# Patient Record
Sex: Male | Born: 1935 | Race: Black or African American | Hispanic: No | Smoking: Former smoker
Health system: Southern US, Community
[De-identification: ages and names within clinical notes are randomized; demographics above are authoritative.]

## PROBLEM LIST (undated history)

## (undated) DIAGNOSIS — I38 Endocarditis, valve unspecified: Secondary | ICD-10-CM

## (undated) DIAGNOSIS — Z860100 Personal history of colon polyps, unspecified: Secondary | ICD-10-CM

## (undated) DIAGNOSIS — K922 Gastrointestinal hemorrhage, unspecified: Secondary | ICD-10-CM

## (undated) DIAGNOSIS — Z952 Presence of prosthetic heart valve: Secondary | ICD-10-CM

## (undated) DIAGNOSIS — K219 Gastro-esophageal reflux disease without esophagitis: Secondary | ICD-10-CM

## (undated) DIAGNOSIS — E119 Type 2 diabetes mellitus without complications: Secondary | ICD-10-CM

## (undated) DIAGNOSIS — R011 Cardiac murmur, unspecified: Secondary | ICD-10-CM

## (undated) DIAGNOSIS — I10 Essential (primary) hypertension: Secondary | ICD-10-CM

## (undated) DIAGNOSIS — R0602 Shortness of breath: Secondary | ICD-10-CM

## (undated) DIAGNOSIS — I350 Nonrheumatic aortic (valve) stenosis: Secondary | ICD-10-CM

## (undated) DIAGNOSIS — I2510 Atherosclerotic heart disease of native coronary artery without angina pectoris: Secondary | ICD-10-CM

---

## 1998-10-03 HISTORY — PX: AORTIC VALVE REPLACEMENT: SHX41

## 1999-01-21 ENCOUNTER — Ambulatory Visit (HOSPITAL_COMMUNITY): Admission: RE | Admit: 1999-01-21 | Discharge: 1999-01-21 | Payer: Self-pay | Admitting: Cardiology

## 1999-03-18 ENCOUNTER — Observation Stay (HOSPITAL_COMMUNITY): Admission: AD | Admit: 1999-03-18 | Discharge: 1999-03-18 | Payer: Self-pay | Admitting: Cardiology

## 1999-03-24 ENCOUNTER — Encounter (HOSPITAL_COMMUNITY): Payer: Self-pay | Admitting: Dentistry

## 1999-03-24 ENCOUNTER — Encounter (HOSPITAL_COMMUNITY): Admission: RE | Admit: 1999-03-24 | Discharge: 1999-06-22 | Payer: Self-pay | Admitting: Dentistry

## 1999-03-30 ENCOUNTER — Ambulatory Visit (HOSPITAL_COMMUNITY): Admission: RE | Admit: 1999-03-30 | Discharge: 1999-03-30 | Payer: Self-pay | Admitting: Dentistry

## 1999-03-30 ENCOUNTER — Encounter (HOSPITAL_COMMUNITY): Payer: Self-pay | Admitting: Dentistry

## 1999-04-09 ENCOUNTER — Ambulatory Visit
Admission: RE | Admit: 1999-04-09 | Discharge: 1999-04-09 | Payer: Self-pay | Admitting: Thoracic Surgery (Cardiothoracic Vascular Surgery)

## 1999-04-09 ENCOUNTER — Encounter: Payer: Self-pay | Admitting: Thoracic Surgery (Cardiothoracic Vascular Surgery)

## 1999-04-26 ENCOUNTER — Encounter: Payer: Self-pay | Admitting: Thoracic Surgery (Cardiothoracic Vascular Surgery)

## 1999-04-27 ENCOUNTER — Inpatient Hospital Stay (HOSPITAL_COMMUNITY)
Admission: RE | Admit: 1999-04-27 | Discharge: 1999-05-07 | Payer: Self-pay | Admitting: Thoracic Surgery (Cardiothoracic Vascular Surgery)

## 1999-04-27 ENCOUNTER — Encounter: Payer: Self-pay | Admitting: Thoracic Surgery (Cardiothoracic Vascular Surgery)

## 1999-04-28 ENCOUNTER — Encounter: Payer: Self-pay | Admitting: Thoracic Surgery (Cardiothoracic Vascular Surgery)

## 1999-04-29 ENCOUNTER — Encounter: Payer: Self-pay | Admitting: Thoracic Surgery (Cardiothoracic Vascular Surgery)

## 1999-04-30 ENCOUNTER — Encounter: Payer: Self-pay | Admitting: Thoracic Surgery (Cardiothoracic Vascular Surgery)

## 1999-05-09 ENCOUNTER — Emergency Department (HOSPITAL_COMMUNITY): Admission: EM | Admit: 1999-05-09 | Discharge: 1999-05-09 | Payer: Self-pay | Admitting: Emergency Medicine

## 1999-09-28 ENCOUNTER — Ambulatory Visit (HOSPITAL_COMMUNITY): Admission: RE | Admit: 1999-09-28 | Discharge: 1999-09-28 | Payer: Self-pay | Admitting: Internal Medicine

## 1999-09-28 ENCOUNTER — Encounter (HOSPITAL_BASED_OUTPATIENT_CLINIC_OR_DEPARTMENT_OTHER): Payer: Self-pay | Admitting: Internal Medicine

## 2000-07-29 ENCOUNTER — Emergency Department (HOSPITAL_COMMUNITY): Admission: EM | Admit: 2000-07-29 | Discharge: 2000-07-29 | Payer: Self-pay | Admitting: Emergency Medicine

## 2002-10-08 ENCOUNTER — Encounter: Payer: Self-pay | Admitting: Cardiology

## 2002-10-08 ENCOUNTER — Inpatient Hospital Stay (HOSPITAL_COMMUNITY): Admission: AD | Admit: 2002-10-08 | Discharge: 2002-10-22 | Payer: Self-pay | Admitting: Cardiology

## 2002-10-11 ENCOUNTER — Encounter (INDEPENDENT_AMBULATORY_CARE_PROVIDER_SITE_OTHER): Payer: Self-pay | Admitting: *Deleted

## 2005-07-21 ENCOUNTER — Inpatient Hospital Stay (HOSPITAL_COMMUNITY): Admission: EM | Admit: 2005-07-21 | Discharge: 2005-08-02 | Payer: Self-pay | Admitting: Emergency Medicine

## 2007-10-04 HISTORY — PX: CHOLECYSTECTOMY: SHX55

## 2008-06-23 ENCOUNTER — Emergency Department (HOSPITAL_COMMUNITY): Admission: EM | Admit: 2008-06-23 | Discharge: 2008-06-24 | Payer: Self-pay | Admitting: Emergency Medicine

## 2008-08-18 ENCOUNTER — Encounter (INDEPENDENT_AMBULATORY_CARE_PROVIDER_SITE_OTHER): Payer: Self-pay | Admitting: Surgery

## 2008-08-18 ENCOUNTER — Ambulatory Visit (HOSPITAL_COMMUNITY): Admission: RE | Admit: 2008-08-18 | Discharge: 2008-08-18 | Payer: Self-pay | Admitting: Surgery

## 2009-09-16 IMAGING — RF DG CHOLANGIOGRAM OPERATIVE
1 series · 6 of 6 positions shown · non-contrast
Comparison: Ultrasound 06/24/2008

CLINICAL DATA: Chronic cholecystitis

INTRAOPERATIVE CHOLANGIOGRAM
TECHNIQUE: Multiple fluoroscopic spot radiographs were obtained
during intraoperative cholangiogram and are submitted for
interpretation post-operatively.

[Series 1: run · 3 acquisitions, 6 frames shown]
[im 1/3]
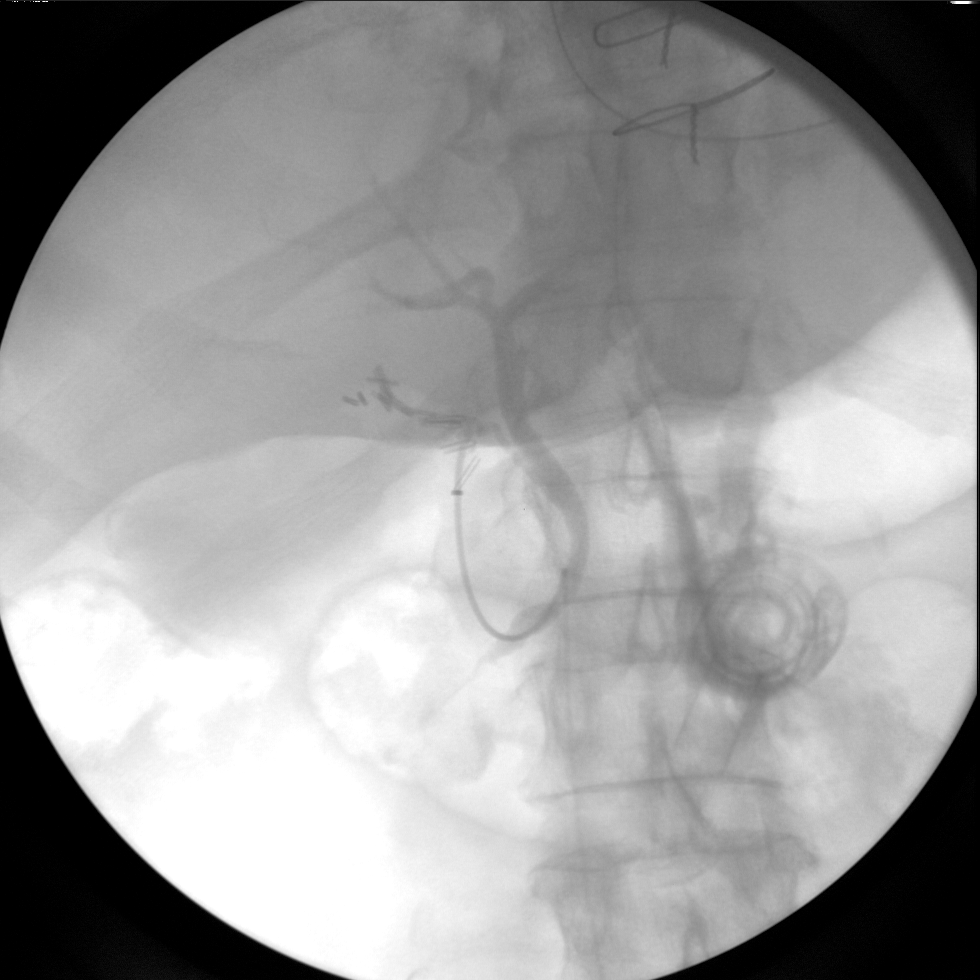
[im 1/3]
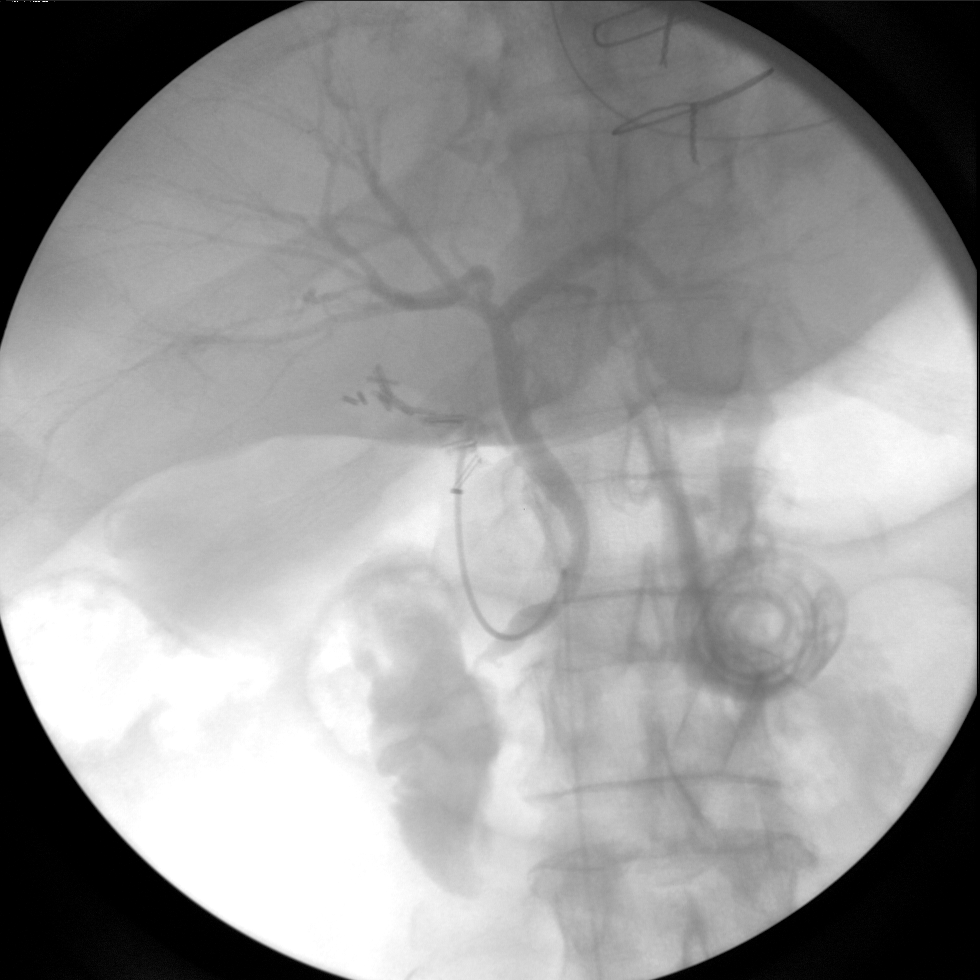
[im 1/3]
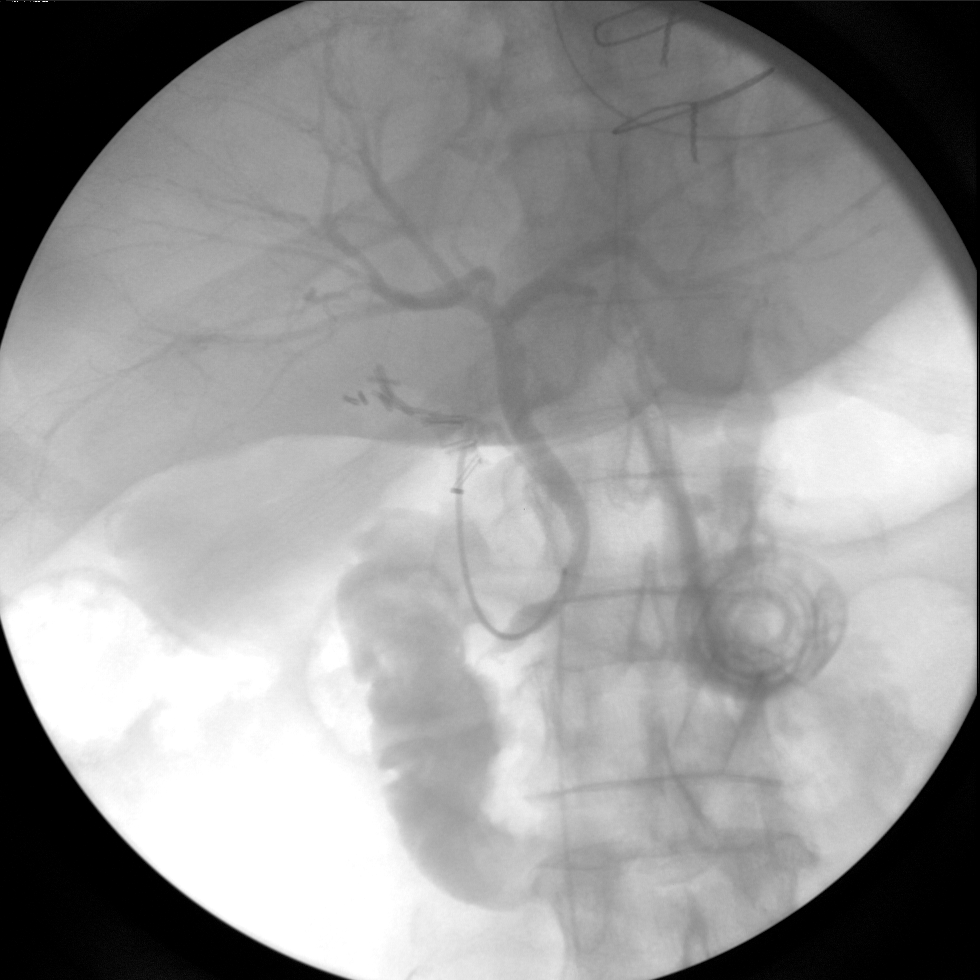
[im 1/3]
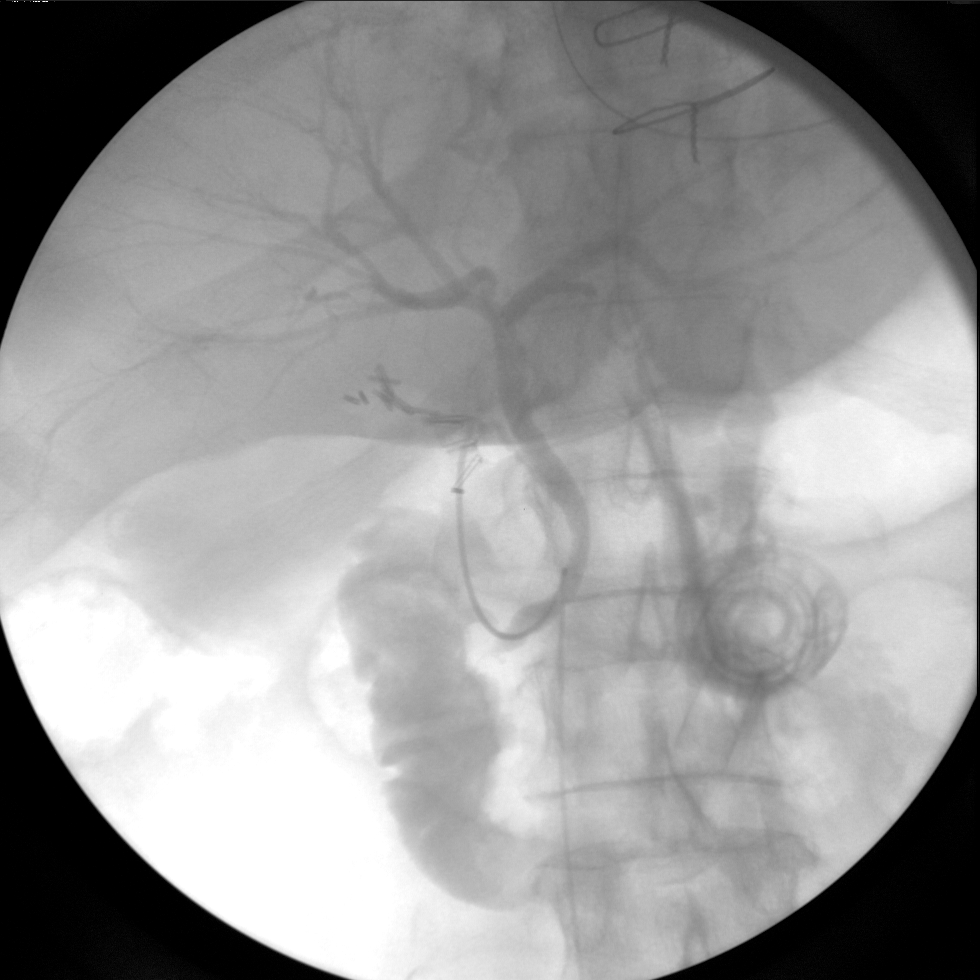
[im 2/3]
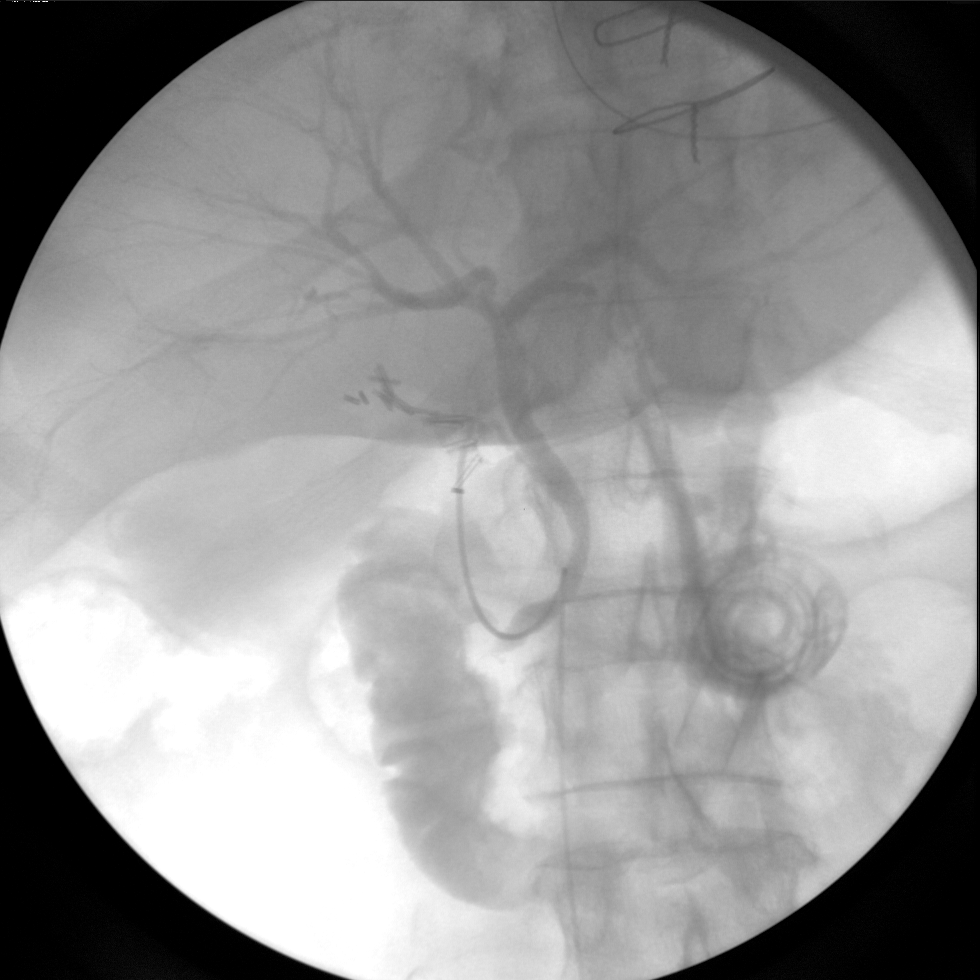
[im 3/3]
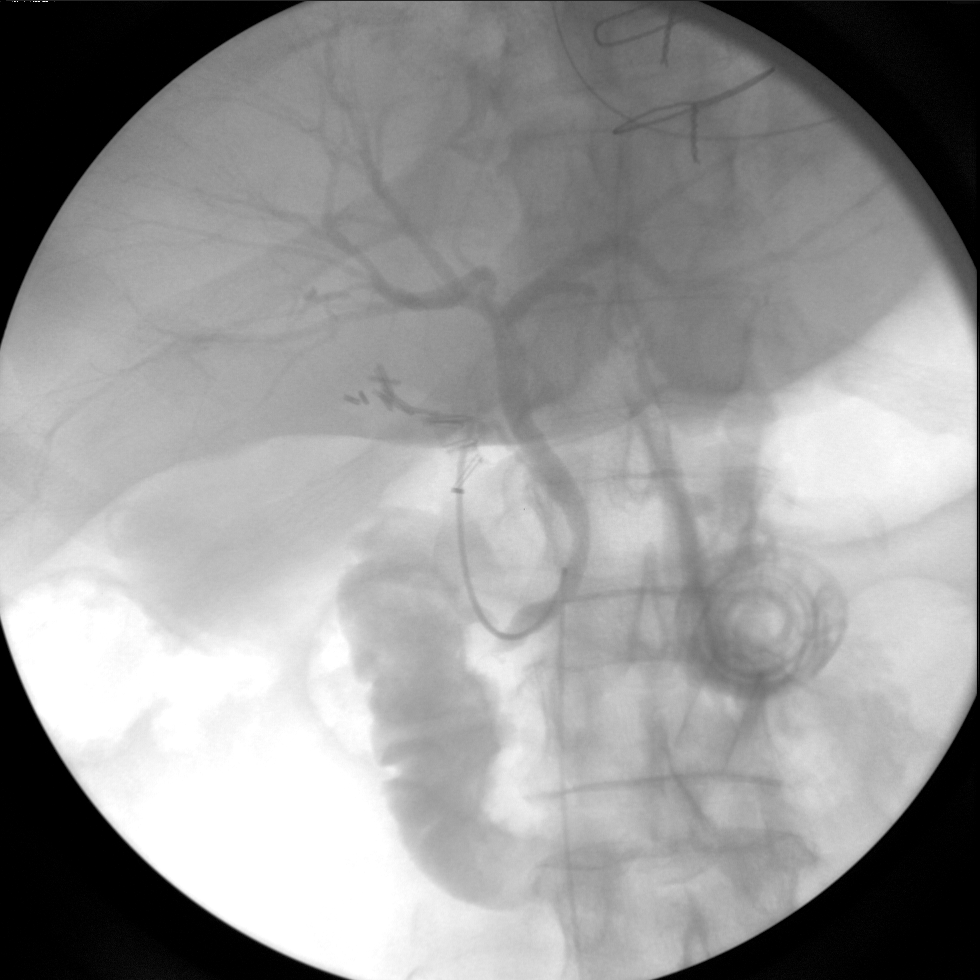

[6 of 6 positions shown; findings below may reference images not displayed]

FINDINGS: Contrast injection of the cystic duct leads to the
excellent degree of opacification of the biliary ducts.  No ductal
stricture, dilatation, or stones.  Contrast passes into the
duodenum.  IMPRESSION:
Negative operative cholangiogram.

## 2010-11-23 ENCOUNTER — Emergency Department (HOSPITAL_COMMUNITY): Payer: Medicare Other

## 2010-11-23 ENCOUNTER — Inpatient Hospital Stay (HOSPITAL_COMMUNITY)
Admission: EM | Admit: 2010-11-23 | Discharge: 2010-11-30 | DRG: 378 | Disposition: A | Discharge status: Home / Self Care | Payer: Medicare Other | Attending: Cardiology | Admitting: Cardiology

## 2010-11-23 DIAGNOSIS — Z860100 Personal history of colon polyps, unspecified: Secondary | ICD-10-CM

## 2010-11-23 DIAGNOSIS — K2970 Gastritis, unspecified, without bleeding: Secondary | ICD-10-CM | POA: Diagnosis present

## 2010-11-23 DIAGNOSIS — I10 Essential (primary) hypertension: Secondary | ICD-10-CM | POA: Diagnosis present

## 2010-11-23 DIAGNOSIS — K644 Residual hemorrhoidal skin tags: Secondary | ICD-10-CM | POA: Diagnosis present

## 2010-11-23 DIAGNOSIS — Z954 Presence of other heart-valve replacement: Secondary | ICD-10-CM

## 2010-11-23 DIAGNOSIS — K5900 Constipation, unspecified: Secondary | ICD-10-CM | POA: Diagnosis present

## 2010-11-23 DIAGNOSIS — K648 Other hemorrhoids: Secondary | ICD-10-CM | POA: Diagnosis present

## 2010-11-23 DIAGNOSIS — E7800 Pure hypercholesterolemia, unspecified: Secondary | ICD-10-CM | POA: Diagnosis present

## 2010-11-23 DIAGNOSIS — D126 Benign neoplasm of colon, unspecified: Secondary | ICD-10-CM | POA: Diagnosis present

## 2010-11-23 DIAGNOSIS — K219 Gastro-esophageal reflux disease without esophagitis: Secondary | ICD-10-CM | POA: Diagnosis present

## 2010-11-23 DIAGNOSIS — E119 Type 2 diabetes mellitus without complications: Secondary | ICD-10-CM | POA: Diagnosis present

## 2010-11-23 DIAGNOSIS — Z7901 Long term (current) use of anticoagulants: Secondary | ICD-10-CM

## 2010-11-23 DIAGNOSIS — D131 Benign neoplasm of stomach: Secondary | ICD-10-CM | POA: Diagnosis present

## 2010-11-23 DIAGNOSIS — D62 Acute posthemorrhagic anemia: Secondary | ICD-10-CM | POA: Diagnosis present

## 2010-11-23 DIAGNOSIS — K921 Melena: Principal | ICD-10-CM | POA: Diagnosis present

## 2010-11-23 DIAGNOSIS — R079 Chest pain, unspecified: Secondary | ICD-10-CM | POA: Diagnosis present

## 2010-11-23 DIAGNOSIS — I2510 Atherosclerotic heart disease of native coronary artery without angina pectoris: Secondary | ICD-10-CM | POA: Diagnosis present

## 2010-11-23 DIAGNOSIS — E669 Obesity, unspecified: Secondary | ICD-10-CM | POA: Diagnosis present

## 2010-11-23 DIAGNOSIS — R109 Unspecified abdominal pain: Secondary | ICD-10-CM | POA: Diagnosis present

## 2010-11-23 LAB — COMPREHENSIVE METABOLIC PANEL
AST: 24 U/L (ref 0–37)
Alkaline Phosphatase: 50 U/L (ref 39–117)
BUN: 33 mg/dL — ABNORMAL HIGH (ref 6–23)
CO2: 25 mEq/L (ref 19–32)
Chloride: 114 mEq/L — ABNORMAL HIGH (ref 96–112)
Creatinine, Ser: 1.28 mg/dL (ref 0.4–1.5)
GFR calc non Af Amer: 55 mL/min — ABNORMAL LOW (ref >60)
Potassium: 4.3 mEq/L (ref 3.5–5.1)
Total Bilirubin: 0.5 mg/dL (ref 0.3–1.2)

## 2010-11-23 LAB — POCT CARDIAC MARKERS
CKMB, poc: <1 ng/mL — ABNORMAL LOW (ref 1.0–8.0)
Myoglobin, poc: 85.6 ng/mL (ref 12–200)
Troponin i, poc: <0.05 ng/mL (ref 0.00–0.09)

## 2010-11-23 LAB — AMYLASE: Amylase: 124 U/L — ABNORMAL HIGH (ref 0–105)

## 2010-11-23 LAB — URINALYSIS, ROUTINE W REFLEX MICROSCOPIC
Ketones, ur: NEGATIVE mg/dL
Nitrite: NEGATIVE
Protein, ur: NEGATIVE mg/dL
Urobilinogen, UA: 1 mg/dL (ref 0.0–1.0)
pH: 6 (ref 5.0–8.0)

## 2010-11-23 LAB — CBC
Hemoglobin: 10.8 g/dL — ABNORMAL LOW (ref 13.0–17.0)
MCH: 28.1 pg (ref 26.0–34.0)
MCV: 81.8 fL (ref 78.0–100.0)
RBC: 3.85 MIL/uL — ABNORMAL LOW (ref 4.22–5.81)

## 2010-11-23 LAB — HEPATIC FUNCTION PANEL
Albumin: 3.5 g/dL (ref 3.5–5.2)
Bilirubin, Direct: 0.2 mg/dL (ref 0.0–0.3)
Indirect Bilirubin: 0.5 mg/dL (ref 0.3–0.9)
Total Bilirubin: 0.7 mg/dL (ref 0.3–1.2)

## 2010-11-23 LAB — TYPE AND SCREEN: Antibody Screen: NEGATIVE

## 2010-11-23 LAB — DIFFERENTIAL
Basophils Relative: 0 % (ref 0–1)
Lymphs Abs: 2.4 10*3/uL (ref 0.7–4.0)
Monocytes Relative: 8 % (ref 3–12)
Neutro Abs: 4.8 10*3/uL (ref 1.7–7.7)
Neutrophils Relative %: 60 % (ref 43–77)

## 2010-11-23 LAB — POCT I-STAT, CHEM 8
BUN: 41 mg/dL — ABNORMAL HIGH (ref 6–23)
Creatinine, Ser: 1.6 mg/dL — ABNORMAL HIGH (ref 0.4–1.5)
Sodium: 141 mEq/L (ref 135–145)
TCO2: 22 mmol/L (ref 0–100)

## 2010-11-23 LAB — CK TOTAL AND CKMB (NOT AT ARMC): Relative Index: INVALID (ref 0.0–2.5)

## 2010-11-23 LAB — PROTIME-INR: INR: 2.93 — ABNORMAL HIGH (ref 0.00–1.49)

## 2010-11-23 LAB — APTT: aPTT: 35 seconds (ref 24–37)

## 2010-11-23 LAB — LIPASE, BLOOD: Lipase: 44 U/L (ref 11–59)

## 2010-11-23 LAB — CARDIAC PANEL(CRET KIN+CKTOT+MB+TROPI)
Relative Index: INVALID (ref 0.0–2.5)
Total CK: 67 U/L (ref 7–232)

## 2010-11-23 LAB — HEMOGLOBIN A1C: Mean Plasma Glucose: 128 mg/dL — ABNORMAL HIGH (ref <117)

## 2010-11-24 LAB — CBC
HCT: 27.4 % — ABNORMAL LOW (ref 39.0–52.0)
Hemoglobin: 9.2 g/dL — ABNORMAL LOW (ref 13.0–17.0)
MCV: 81.1 fL (ref 78.0–100.0)
RDW: 15.8 % — ABNORMAL HIGH (ref 11.5–15.5)
WBC: 9.8 10*3/uL (ref 4.0–10.5)

## 2010-11-24 LAB — LIPID PANEL
Cholesterol: 167 mg/dL (ref 0–200)
HDL: 42 mg/dL (ref >39)
LDL Cholesterol: 93 mg/dL (ref 0–99)
Triglycerides: 158 mg/dL — ABNORMAL HIGH (ref <150)

## 2010-11-24 LAB — BASIC METABOLIC PANEL
CO2: 27 mEq/L (ref 19–32)
Chloride: 106 mEq/L (ref 96–112)
Creatinine, Ser: 1.15 mg/dL (ref 0.4–1.5)
GFR calc Af Amer: >60 mL/min (ref >60)
Potassium: 4.2 mEq/L (ref 3.5–5.1)

## 2010-11-24 LAB — CARDIAC PANEL(CRET KIN+CKTOT+MB+TROPI): CK, MB: 1.5 ng/mL (ref 0.3–4.0)

## 2010-11-24 LAB — PROTIME-INR: INR: 3.28 — ABNORMAL HIGH (ref 0.00–1.49)

## 2010-11-25 LAB — BASIC METABOLIC PANEL
CO2: 28 mEq/L (ref 19–32)
Chloride: 104 mEq/L (ref 96–112)
Creatinine, Ser: 1.21 mg/dL (ref 0.4–1.5)
GFR calc Af Amer: >60 mL/min (ref >60)
Sodium: 138 mEq/L (ref 135–145)

## 2010-11-25 LAB — CBC
HCT: 25.9 % — ABNORMAL LOW (ref 39.0–52.0)
Hemoglobin: 8.8 g/dL — ABNORMAL LOW (ref 13.0–17.0)
MCV: 82 fL (ref 78.0–100.0)
RBC: 3.16 MIL/uL — ABNORMAL LOW (ref 4.22–5.81)
RDW: 15.8 % — ABNORMAL HIGH (ref 11.5–15.5)
WBC: 9.1 10*3/uL (ref 4.0–10.5)

## 2010-11-25 LAB — PROTIME-INR: INR: 2.38 — ABNORMAL HIGH (ref 0.00–1.49)

## 2010-11-26 LAB — BASIC METABOLIC PANEL
CO2: 25 mEq/L (ref 19–32)
Calcium: 9.1 mg/dL (ref 8.4–10.5)
GFR calc Af Amer: >60 mL/min (ref >60)
GFR calc non Af Amer: 52 mL/min — ABNORMAL LOW (ref >60)
Glucose, Bld: 122 mg/dL — ABNORMAL HIGH (ref 70–99)
Potassium: 3.7 mEq/L (ref 3.5–5.1)
Sodium: 138 mEq/L (ref 135–145)

## 2010-11-26 LAB — CBC
HCT: 24.4 % — ABNORMAL LOW (ref 39.0–52.0)
Hemoglobin: 8.5 g/dL — ABNORMAL LOW (ref 13.0–17.0)
MCH: 28.8 pg (ref 26.0–34.0)
RBC: 2.95 MIL/uL — ABNORMAL LOW (ref 4.22–5.81)

## 2010-11-27 DIAGNOSIS — K921 Melena: Secondary | ICD-10-CM

## 2010-11-27 LAB — CBC
HCT: 22.6 % — ABNORMAL LOW (ref 39.0–52.0)
MCH: 27.9 pg (ref 26.0–34.0)
MCV: 83.1 fL (ref 78.0–100.0)
RBC: 2.72 MIL/uL — ABNORMAL LOW (ref 4.22–5.81)
WBC: 9 10*3/uL (ref 4.0–10.5)

## 2010-11-27 LAB — BASIC METABOLIC PANEL
BUN: 8 mg/dL (ref 6–23)
Chloride: 108 mEq/L (ref 96–112)
Creatinine, Ser: 1.22 mg/dL (ref 0.4–1.5)
Glucose, Bld: 105 mg/dL — ABNORMAL HIGH (ref 70–99)
Potassium: 3.5 mEq/L (ref 3.5–5.1)

## 2010-11-28 DIAGNOSIS — K921 Melena: Secondary | ICD-10-CM

## 2010-11-28 LAB — CROSSMATCH: Unit division: 0

## 2010-11-28 LAB — CBC
HCT: 25.6 % — ABNORMAL LOW (ref 39.0–52.0)
MCH: 28.7 pg (ref 26.0–34.0)
MCHC: 34 g/dL (ref 30.0–36.0)
MCV: 84.5 fL (ref 78.0–100.0)
RDW: 16.5 % — ABNORMAL HIGH (ref 11.5–15.5)
WBC: 10 10*3/uL (ref 4.0–10.5)

## 2010-11-29 LAB — CBC
HCT: 28.7 % — ABNORMAL LOW (ref 39.0–52.0)
MCH: 28.9 pg (ref 26.0–34.0)
MCV: 85.4 fL (ref 78.0–100.0)
RDW: 16.7 % — ABNORMAL HIGH (ref 11.5–15.5)
WBC: 9.5 10*3/uL (ref 4.0–10.5)

## 2010-11-29 LAB — BASIC METABOLIC PANEL
BUN: 8 mg/dL (ref 6–23)
GFR calc non Af Amer: >60 mL/min (ref >60)
Glucose, Bld: 98 mg/dL (ref 70–99)
Potassium: 4 mEq/L (ref 3.5–5.1)

## 2010-11-30 LAB — CBC
Hemoglobin: 9.8 g/dL — ABNORMAL LOW (ref 13.0–17.0)
MCH: 29.1 pg (ref 26.0–34.0)
MCV: 86.1 fL (ref 78.0–100.0)
Platelets: 294 10*3/uL (ref 150–400)
RBC: 3.37 MIL/uL — ABNORMAL LOW (ref 4.22–5.81)

## 2010-11-30 LAB — BASIC METABOLIC PANEL
CO2: 31 mEq/L (ref 19–32)
Glucose, Bld: 104 mg/dL — ABNORMAL HIGH (ref 70–99)
Potassium: 4.2 mEq/L (ref 3.5–5.1)
Sodium: 140 mEq/L (ref 135–145)

## 2011-01-04 NOTE — Consult Note (Signed)
[name], [name] NO.:  [id number]  MEDICAL RECORD NO.:  [id number]           PATIENT TYPE:  I  LOCATION:  3709                         FACILITY:  [facility]  PHYSICIAN:  Brian Hawks. Elnoria Howard, MD    DATE OF BIRTH:  [birth year]  DATE OF CONSULTATION:  11/23/2010 DATE OF DISCHARGE:                                CONSULTATION   REASON FOR CONSULTATION:  Anemia, heme-positive stool, and melena.  REFERRING PHYSICIAN:  Eduardo Powell. [name], M.D.  HISTORY OF PRESENT ILLNESS:  This is a 77 year old gentleman with a past medical history of coronary artery disease, status post AVR for severe aortic stenosis, hypertension, hyperlipidemia, gastroesophageal reflux disease, diabetes, and colonic polyps who is admitted to the hospital with complaints of abdominal pain and also melena.  The patient states that his abdominal pain has been chronic and has been unchanged over the years; however, what concerned him is when he started to see melena.  In the past, he did have issues with a GI bleed in 2004, and he underwent an EGD and colonoscopy by Dr. Loreta Ave.  At that time, he was only noted to have some adenomatous polyps.  The EGD was negative for any obvious bleeding site.  At that time, the patient was also on Coumadin.  Since that event, the patient has not had any further issues with bleeding until this time.  He denies having taken any NSAIDs, and his INR appears to be in the therapeutic window at this time.  He currently takes omeprazole 40 mg p.o. daily and has been taking that for a number of years currently.  During this evaluation, his hemoglobin is noted to be at 11.6, but the last value was in 2009, and it was in the 13 range.  PAST MEDICAL HISTORY AND PAST SURGICAL HISTORY:  As stated above, with the addition of status post laparoscopic cholecystectomy in 2009 for cholelithiasis.  SOCIAL HISTORY:  Negative for alcohol, tobacco, or illicit drug use currently.  FAMILY  HISTORY:  Noncontributory.  ALLERGIES:  No known drug allergies.  MEDICATIONS: 1. Metoprolol 12.5 mg p.o. b.i.d. 2. Nitroglycerin 0.5 inches transdermal patch q.6 h. 3. Protonix 40 mg IV q.12 h. 4. Crestor 20 mg p.o. daily.  PHYSICAL EXAMINATION:  VITAL SIGNS:  Blood pressure is 165/90, heart rate is 82, respirations 20, and temperature is 97.7. GENERAL:  The patient is in no acute distress, alert and oriented. HEENT:  Normocephalic, atraumatic.  Extraocular muscles intact. NECK:  Supple.  No lymphadenopathy. LUNGS:  Clear to auscultation bilaterally. CARDIOVASCULAR:  Regular rate and rhythm. ABDOMEN:  Moderately obese, soft, nontender, and nondistended.  Positive bowel sounds. EXTREMITIES:  No clubbing, cyanosis, or edema. RECTAL:  Significant for melena and is heme-positive.  LABORATORY VALUES:  White blood cell count is 7.9, hemoglobin 11.6, MCV is 81.8, and platelets at 239.  PT is 30.6, INR 2.9.  Sodium 141, potassium 2.9, chloride 110, glucose 123, BUN 41, and creatinine 1.6. Total bilirubin 0.7, alk phos 55, AST 30, ALT 42, and albumin 3.5.  IMPRESSION: 1. Heme-positive stool. 2. Melena. 3. Chronic midabdominal discomfort.  At this time, it  is clear that the patient does have a bleeding source. There is overt melena, and in light of his complaint of upper abdominal pain and maybe that he has an upper GI source, further evaluation with an EGD is necessary at this time.  His INR is currently elevated, and I think a diagnostic EGD will be acceptable.  His hemoglobin needs to be followed and transfusion will be required, and also, reversal of his anticoagulation will be necessary if a significant loss of blood is noted.     Brian Hawks Elnoria Howard, MD     PDH/MEDQ  D:  11/23/2010  T:  11/24/2010  Job:  [id number]  Electronically Signed by Jeani Hawking MD on 01/04/2011 08:51:41 AM

## 2011-01-06 NOTE — Discharge Summary (Signed)
[name], [name] NO.:  [id number]  MEDICAL RECORD NO.:  [id number]           PATIENT TYPE:  I  LOCATION:  3709                         FACILITY:  [facility]  PHYSICIAN:  Chisa Kushner N. Sharyn Lull, M.D. DATE OF BIRTH:  09/24/1933  DATE OF ADMISSION:  11/23/2010 DATE OF DISCHARGE:  11/30/2010                              DISCHARGE SUMMARY   ADMITTING DIAGNOSES: 1. Chest pain, rule out myocardial infarction. 2. Abdominal pain with black tarry stools, rule out upper     gastrointestinal bleeding. 3. Anemia secondary to above. 4. Status post aortic valve replacement. 5. Hypertension. 6. Non-insulin-dependent diabetes mellitus, controlled by diet. 7. Hypercholesteremia. 8. History of colonic polyps. 9. Gastroesophageal reflux disease.  DISCHARGE DIAGNOSES: 1. Status post gastrointestinal bleeding, stable, source unknown. 2. Status post atypical chest pain, myocardial infarction ruled out. 3. Mild coronary artery disease in the past status post aortic valve     replacement. 4. Hypertension. 5. Non-insulin-dependent diabetes mellitus, controlled by diet. 6. Hypercholesteremia. 7. History of colonic polyp. 8. Gastroesophageal reflux disease. 9. Mild gastritis. 10.Anemia secondary to gastrointestinal bleeding.  DISCHARGE HOME MEDICATIONS: 1. Coumadin 6.5 mg daily. 2. Metoprolol 25 mg 1/2 tablet twice daily. 3. Quinapril 20 mg 1 tablet twice daily. 4. Simvastatin 80 mg 1 tablet daily at night. 5. Omeprazole 40 mg 1 capsule every morning. 6. Proscar 5 mg 1 tablet daily as before. 7. Lumigan eyedrops as before. 8. Feosol 325 mg 1 tablet twice daily.  DIET:  Low salt, low cholesterol.  The patient has been advised to avoid green leafy vegetables.  Also, the patient has been advised to avoid sweets.  Increase activity slowly as tolerated.  CONDITION AT DISCHARGE:  Stable.  Follow up with me in 1 week and Dr. Loreta Ave in 2 weeks.  The patient has been advised if he  feels dizzy, lightheaded, weak, or notices any black tarry, foul swelling stool should report to me immediately and go to the ER.  We will check CBC and BMET in 1 week.  We will check ProTime also in 1 week.  BRIEF HISTORY AND HOSPITAL COURSE:  Mr. Loughry is a 77 year old black male with past medical history significant for mild coronary artery disease, history of severe aortic stenosis status post AVR, hypertension, hypercholesteremia, GERD, history of colonic polyp, non- insulin-dependent diabetes mellitus controlled by diet.  He came to the ER complaining of retrosternal and upper abdominal tightness associated with feeling weak, dizzy, nausea, and diaphoresis.  He states he noticed black tarry stools on Sunday and this morning.  He denies any syncopal episode.  The patient was noted to have blood pressure of 80-90 systolic, received fluid challenge in the ER with normalization of blood pressure.  He denies any chest pain at present.  The patient gives history of exertional chest pain off and on associated with dyspnea.  He denies any PND, orthopnea, leg swelling.  He denies palpitation, lightheadedness, or syncope.  He denies cough, fever, or chills.  He denies any bright red blood per rectum or hematuria.  PAST MEDICAL HISTORY:  As above.  PAST SURGICAL HISTORY:  He had AVR in  the past, had cholecystectomy in November 2009.  ALLERGIES:  No known drug allergies.  MEDICATION AT HOME: 1. He is on Coumadin 6.5 mg 5 days a week and 8 mg 2 days a week. 2. Quinapril 20 mg p.o. b.i.d. 3. Lopressor 25 mg 1/2 tablet b.i.d. 4. Simvastatin 80 mg p.o. at bedtime. 5. Omeprazole 40 mg p.o. daily. 6. Proscar 5 mg p.o. daily.  SOCIAL HISTORY:  He is widowed, retired, worked in [employer] in the past. No history of smoking or alcohol abuse.  Born in [place], lives now in [place].  FAMILY HISTORY:  Noncontributory.  PHYSICAL EXAMINATION:  GENERAL:  He is alert, awake, and oriented x3,  in no acute distress. VITAL SIGNS:  Blood pressures when seen in the ER was 145/53, pulse was 63 and regular. HEENT:  Conjunctivae pink. NECK:  Supple.  No JVD, no bruit. RESPIRATORY:  Lungs are clear to auscultation without rhonchi or rales. CARDIOVASCULAR:  S1 and S2 was normal.  There was soft systolic murmur. Metallic heart sounds.  There was no S3 gallop. ABDOMEN:  Soft.  Bowel sounds were present, nontender. EXTREMITIES:  There is no clubbing, cyanosis, or edema.  LABS:  Hemoglobin was 10.8, hematocrit 31.5, white count of 7.9.  His PT/INR, PT was 30.6, INR 2.93.  Stool occult blood was positive.  Lipase was 44.  Two sets of cardiac enzymes were negative.  Hemoglobin A1c was 6.1.  Repeat hemoglobin was 9.2 on November 24, 2010.  On November 27, 2010, hemoglobin was 7.6, hematocrit 22.6.  The patient received 1 unit of packed RBCs.  Hemoglobin on November 28, 2010 was 8.7, hematocrit 25.6, white count of 10.0.  On November 29, 2010, hemoglobin was 9.7, hematocrit 28.7.  Today, hemoglobin is 9.8, hematocrit 29, white count of 8.7.  INR today is 2.12.  His sodium is 139, potassium 4.0, BUN 8, creatinine 1.18.  Glucose was 98.  BRIEF HOSPITAL COURSE:  The patient was admitted to the telemetry unit. MI was ruled out by serial enzymes and EKG.  The patient subsequently underwent GI consultation, was obtained by Dr. Loreta Ave and Dr. Elnoria Howard.  The patient subsequently underwent upper endoscopy which showed normal esophagus and gastroesophageal junction.  There was patchy gastritis in mid body of the stomach.  Few sessile polyps in the mid body of the stomach were also noted.  The patient subsequently underwent colonoscopy on November 26, 2010, by Dr. Elnoria Howard, which showed sessile polyp, internal and external hemorrhoids.  There was no evidence of active bleeding. The patient subsequently underwent capsule endoscopy which was also not revealing for any active GI bleeding, capsule remained in the  stomach for more than 2-1/2 hours and so the battery expired before complete view of the small bowel.  The patient's hemoglobin has remained stable. The patient was restarted on Coumadin.  Her INR today is 2.12.  The patient has no further active bleeding and is feeling fine.  The patient will be discharged home on above medications and will be followed up in my office and GI as above.  The patient may require capsule study as an outpatient per GI.     Eduardo Osier. Sharyn Lull, M.D.     MNH/MEDQ  D:  11/30/2010  T:  11/30/2010  Job:  [id number]  cc:   Jordan Hawks. Elnoria Howard, MD Anselmo Rod, MD, [facility]  Electronically Signed by Rinaldo Cloud M.D. on 01/06/2011 10:24:14 PM

## 2011-02-15 NOTE — Op Note (Signed)
[name], [name] NO.:  [id number]   MEDICAL RECORD NO.:  [id number]          PATIENT TYPE:  AMB   LOCATION:  SDS                          FACILITY:  [facility]   PHYSICIAN:  Ardeth Sportsman, MD     DATE OF BIRTH:  [birth year]   DATE OF PROCEDURE:  08/18/2008  DATE OF DISCHARGE:  08/18/2008                               OPERATIVE REPORT   PRIMARY CARE PHYSICIAN:  1. Cardiologist, Eduardo Osier. Sharyn Lull, MD  2. Gastroenterologist, Anselmo Rod, MD   SURGEON:  Ardeth Sportsman, MD.   ASSISTANT:  RNFA.   PREOPERATIVE DIAGNOSIS:  Chronic cholecystitis.   POSTOPERATIVE DIAGNOSIS:  Chronic cholecystitis.   PROCEDURE PERFORMED:  Laparoscopic cholecystectomy with intraoperative  cholangiogram.   ANESTHESIA:  1. General anesthesia.  2. Local anesthetic and a field block on all port sites.   SPECIMEN:  Gallbladder.   DRAINS:  None.   ESTIMATED BLOOD LOSS:  100 mL.   COMPLICATIONS:  No major complications.   INDICATIONS:  [name] is a 77 year old gentleman with cardiopulmonary  issues that are stable, but has a classic story of biliary colic with an  ultrasound showing gallbladder wall thickening, pericholecystic fluid  with evidence concerning for cholecystitis.   The anatomy and physiology of hepatobiliary and pancreatic function was  discussed.  Pathophysiology of cholecystitis was discussed with its  natural history of risks.  Options were discussed.  Recommendation was  made for laparoscopic cholecystectomy with intraoperative cholangiogram.  Risks, benefits, and alternatives were discussed.  Questions were  answered and he agreed to proceed.   OPERATIVE FINDINGS:  He actually had gallbladder wall thickening with  some duodenal adhesions.  His cholangiogram was normal.  He had a  fibrocystic duct/infundibular junction.   DESCRIPTION OF PROCEDURE:  Informed consent was confirmed.  The patient  received IV cefazolin and gentamicin given his history of  aortic valve  replacement.  He had been held on his Coumadin and had been transitioned  to Lovenox shots.  His INR was 1 this morning.  He underwent general  anesthesia without any difficulty.  He had voided just prior entering  the operating room.  He had sequential compression devices active during  the entire case.  He was positioned supine, both arms tucked.  His  abdomen was clipped, prepped, and draped in sterile fashion.   A 5-mm port was placed in the right upper quadrant using optical imaging  technique with the patient in deep reverse Trendelenburg and right side  up.  A capnoperitoneum to 15 mmHg provided a good intraabdominal  insufflation.  Under direct visualization, the final port was placed  through the right flank and hid in the umbilicus.  A 10-mm port was  placed in the right subxiphoid region through a prior mediastinal chest  tube scar.   Diagnostic laparoscopy revealed no intraabdominal injury.  Gallbladder  fundus was grasped and elevated cephalad.  Peritoneal coverings between  the anterior and medial wall of the gallbladder and posterolateral wall  of the gallbladder and the liver were freed off.  The patient had  dilated veins  in gossamer arteries and did bring in some bleeding as the  anterior branch of the cystic artery was densely adherent to the  infundibulum.  Eventually, I was able to hold pressure, skeletonized,  and then isolated.  He had a large posterior branch as well.  There is  some bleeding as well.  One clip was placed on the gallbladder site and  2 clips on the proximal, and the remainder on these branches and they  were transected.  There was still a little bit of oozing, but careful  skeletonization was done and a couple of blood vessels on the the liver  were isolated and clipped and controlled as well.  Copious irrigation  was done and hemostasis was assured and careful inspections to make sure  that these ligations again with the  gallbladder with a good critical  view.  Further dissection was done to free almost all the gallbladder  off the liver bed to have an excellent posterior view in doing this  dissection and excellent critical 360 degree view.  The infundibulum and  proximal cystic duct were isolated almost all the way down to the cystic  duct common bile duct junction since the cystic duct was much short and  fibrotic.   A partial cystic ductotomy was performed at the infundibulum and cystic  duct ridge.  A 4-French Cholangiocath was placed through the right  subxiphoid region through a prior scar site and flushed in.  I initially  could not get in through into the infundibulum and cystic duct  junctions, I had to transect another centimeter or more proximally.  The  catheter passed much more easily there.   Cholangiogram was run using dilute radiopaque contrast and continuous  fluoroscopy.  Contrast flowed well from the side branch consistent with  cystic duct cannulization.  Contrast flowed and refluxed well into the  right and left intrahepatic chains across the hepatic duct and common  bile duct across the normal ampulla into a normal duodenum.  Cholangiocath was removed.  A clip was placed in the cystic duct stump.  I did a 0 PDS Endoloop to ligate the cystic duct stump as well to good  result.   The gallbladder was freed from its main attachments to the liver bed and  brought up through the 2 mm subxiphoid port site with excellent gentle  dilation and evacuation of bile.  There was some small black, granular  stones, but they were very tiny.   Copious irrigation was done with excellent hemostasis.  Capnoperitoneum  was evacuated and ports were removed.  The fascial defect of the  subxiphoid region was closed using 0 Vicryl stitch.  Skin was closed  using 4-0 Monocryl stitch.  Sterile dressing was applied.  The patient  was extubated and sent to recovery room in stable condition.   I explained  the postoperative care with the patient in detail just prior  to surgery and discussed with the family afterwards.      Ardeth Sportsman, MD  Electronically Signed     SCG/MEDQ  D:  08/18/2008  T:  08/19/2008  Job:  [id number]   cc:   Eduardo Osier. Sharyn Lull, M.D.  Anselmo Rod, M.D.

## 2011-02-18 NOTE — Op Note (Signed)
   NAMEJANTZ, MAIN NO.:  [id number]   MEDICAL RECORD NO.:  [id number]                   PATIENT TYPE:  INP   LOCATION:  2023                                 FACILITY:  [facility]   PHYSICIAN:  Anselmo Rod, M.D.               DATE OF BIRTH:  01-15-1933   DATE OF PROCEDURE:  10/10/2002  DATE OF DISCHARGE:                                 OPERATIVE REPORT   PROCEDURE PERFORMED:  Diagnostic esophagogastroduodenoscopy.   ENDOSCOPIST:  Charna Elizabeth, M.D.   INSTRUMENT USED:  Olympus video panendoscope.   INDICATIONS FOR PROCEDURE:  The patient is a 77 year old African-American  male with history of melena and a drop in his hemoglobin down to 7.9 gm/dl.  Rule out peptic ulcer disease, esophagitis, gastritis, etc.  The patient has  had some left upper quadrant pain in the past.   PREPROCEDURE PREPARATION:  Informed consent was procured from the patient.  The patient was fasted for eight hours prior to the procedure and received  10 mg of vitamin K along with 2 units of fresh frozen plasma the night prior  to the procedure.  PT was rechecked.  The INR was 1.4 prior to the  endoscopy.   DESCRIPTION OF PROCEDURE:  The patient was placed in the left lateral  decubitus position and sedated with 40 mg of Demerol and 4 mg of Versed  intravenously.  Once the patient was adequately sedated and maintained on  low-flow oxygen and continuous cardiac monitoring, the Olympus video  panendoscope was advanced through the mouth piece over the tongue into the  esophagus under direct vision.  The entire esophagus appeared normal with no  evidence of ring, stricture, masses, esophagitis or Barrett's mucosa.  The  scope was then advanced to the stomach.  The entire gastric mucosa and the  proximal small bowel appeared normal without lesions.   IMPRESSION:  Normal esophagogastroduodenoscopy, no arteriovenous  malformations, erosions, ulcerations, masses or polyps seen.   RECOMMENDATIONS:  1. Proceed with colonoscopy in the morning.  Instructions written.  2.     Start heparin today.  3. Hold heparin six hours prior to colonoscopy.  This has been discussed     with Dr. Sharyn Lull who is aware of the findings.  Further recommendation     made after the colonoscopy.                                                Anselmo Rod, M.D.    JNM/MEDQ  D:  10/10/2002  T:  10/10/2002  Job:  [id number]   cc:   Eduardo Osier. Sharyn Lull, M.D.  [address]  [place]  [place] [address]  Fax: [phone]

## 2011-02-18 NOTE — Discharge Summary (Signed)
[name], [name]   MEDICAL RECORD NO.:  [id number]                   PATIENT TYPE:  INP   LOCATION:  2023                                 FACILITY:  [facility]   PHYSICIAN:  Mohan N. Sharyn Lull, M.D.              DATE OF BIRTH:  01/01/1933   DATE OF ADMISSION:  10/08/2002  DATE OF DISCHARGE:  10/22/2002                                 DISCHARGE SUMMARY   ADMISSION DIAGNOSES:  1. Upper gastrointestinal bleeding, rule out gastritis, rule out peptic     ulcer disease.  2. Status post aortic valve replacement on Coumadin.  3. Hypertension.  4. Non-insulin-dependent diabetes mellitus.  5. Remote history of tobacco abuse.  6. Remote history of alcohol abuse.  7. Hypercholesterolemia.   FINAL DIAGNOSES:  1. Status post gastrointestinal bleeding.  2. Hypertension.  3. Non-insulin-dependent diabetes mellitus controlled by diet.  4. Status post aortic valve replacement for aortic stenosis.  5. Remote history of alcohol abuse.  6. Remote history of tobacco abuse.  7. Hypercholesterolemia.   DISCHARGE MEDICATIONS:  1. Coumadin 8 mg 1 tablet daily.  2. Altace 10 mg 1 capsule twice daily.  3. Toprol XL 25 mg 1 tablet daily.  4. Clonidine 0.2 mg 1/2 tablet twice daily.  5. Nexium 40 mg 1 capsule daily 1/2 hour before breakfast.  6. Lipitor 20 mg 1 tablet daily.  7. Feosol 325 mg 1 tablet twice daily.   ACTIVITY:  As tolerated.   DIET:  Low-salt, low-cholesterol, 1800 calorie ADA diet.   SPECIAL INSTRUCTIONS:  The patient has been advised to watch for signs of  bleeding.  If he notices any back tarry stools or blood urine, should report  to me immediately; or if he feels dizzy, lightheaded, he should call me  immediately.  CBC and pro time in one week.   FOLLOW UP:  Follow up with me in one week.   CONDITION ON DISCHARGE:  Stable.   BRIEF HISTORY:  The patient is a 77 year old black male with past medical  history significant for  hypertension, severe senile aortic stenosis status  post aortic valve replacement, non-insulin-dependent diabetes mellitus  controlled by diet, hypercholesterolemia, history of family  hyperaldosteronism.  He came to the office complaining of vague left upper  quadrant abdominal pain associated with feeling weak, tired, and dizzy.  The  patient that for the last two to three days, his stools appears to be  slightly dark.  The patient had CBC done yesterday which showed significant  drop of hemoglobin from 12.9 to 8.6, and in last six months.  The patient  denies any chest pain, nausea, vomiting, or diaphoresis.  He denies  palpitations, lightheadedness, or syncopal episodes.  Denies PND, orthopnea,  leg swelling.  Denies fever or chills, cough.  Denies any aspirin intake or  NSAIDs in the recent past.   PAST MEDICAL HISTORY:  As above.  PAST SURGICAL HISTORY:  He had aortic valve replacement in August 2000.   ALLERGIES:  No known drug allergies.   HOME MEDICATIONS:  1. Lipitor 20 mg p.o. daily.  2. Coumadin 8 mg p.o. daily.  3. Altace 10 mg p.o. b.i.d.  4. Baby aspirin 81 mg p.o. daily.  5. Toprol XL 25 mg p.o. daily.  6. Nexium 40 mg p.o. daily.  7. Clonidine 0.2 mg p.o. b.i.d.   SOCIAL HISTORY:  He is widowed, lives with his daughter.  Smoked one pack  per day for 35 years, quit in 1980s.  He used to drink beer, quit  approximately five years ago.  Born in [place], works in [employer].   FAMILY HISTORY:  Negative for coronary artery disease.   PHYSICAL EXAMINATION:  GENERAL:  Alert, awake, oriented x 3.  VITAL SIGNS:  Blood pressure 116/70, pulse 84 and regular.  HEENT:  Conjunctivae pink.  NECK:  Supple.  No JVD, no bruit.  LUNGS:  Clear to auscultation without rhonchi or rales.  CARDIOVASCULAR:  S1 and S2 normal.  There were metallic heart sounds.  ABDOMEN:  Soft.  Bowel sounds were present.  There was mild epigastric  tenderness.  There was no guarding.   EXTREMITIES:  There was no clubbing, cyanosis, or edema.   LABORATORY DATA:  Chest x-ray showed no evidence of active cardiopulmonary  disease.   EKG showed normal sinus rhythm with nonspecific T wave changes.   Admission hemoglobin was 8.6, hematocrit 26.0.  Repeat hemoglobin on January  7 was 7.9, hematocrit 23.4, platelet count 239, white count 6.1.  On January  8 post transfusion of 2 units of packed red blood cells, hemoglobin was 9.6,  hematocrit 38.2, white count 6.2.  Hemoglobin today is 9.6, hematocrit 38.9  which has been stable.   Sodium 132, potassium 3.9, chloride 101, bicarb 26, glucose 105, BUN 21,  creatinine 1.2.  Liver enzymes were normal. Admission PT was 23.8, INR 2.5,  PTT 37.  On January 7, PT was 26, INR 2.9.  On January 8 after receiving FFP  and vitamin K, PT was 16.8, INR 1.4.  Today's PT is 22.7 with INR of 2.3.  Potassium is 4.6, BUN 10, creatinine 1.0.  Hemoglobin 9.6, hematocrit 28.9.   HOSPITAL COURSE:  The patient was admitted to telemetry unit with GI  bleeding.  GI consultation was obtained with Dr. Anselmo Rod,  The  patient received vitamin K and FFP and received 3 units of packed red blood  cells.  The patient did not have any further episodes of bleeding during the  hospital stay.  The patient was restarted on heparin due to metallic aortic  valve which he tolerated well.   The patient subsequently underwent upper endoscopy and colonoscopy on  January 8 and January 9.  There were no ulcers or AVM noted.  The patient  had full colonic polyp snared from the left colon.  The patient was  restarted on heparin and then Coumadin the next day after the polypectomy  and colonoscopy.  The patient did not have any episodes of GI bleeding  during the hospital stay.  His INR today is 2.3.  The patient is eager to go home and will be discharged on the above medications.  We will give her one  dose of 10 mg Coumadin before discharge.  The patient will be  followed up in  my office in one week.  The patient also will be followed by  GI in two  weeks.                                               Eduardo Osier. Sharyn Lull, M.D.    MNH/MEDQ  D:  10/22/2002  T:  10/22/2002  Job:  [id number]   cc:   Anselmo Rod, M.D.  [address]  [address]  [place]  [place] [address]  Fax: [phone]

## 2011-02-18 NOTE — Discharge Summary (Signed)
[name], [name] NO.:  [id number]   MEDICAL RECORD NO.:  [id number]          PATIENT TYPE:  INP   LOCATION:  2010                         FACILITY:  [facility]   PHYSICIAN:  Mohan N. Sharyn Powell, M.D. DATE OF BIRTH:  10-27-1932   DATE OF ADMISSION:  07/21/2005  DATE OF DISCHARGE:  08/02/2005                                 DISCHARGE SUMMARY   ADMISSION DIAGNOSES:  1.  Gross hematuria secondary to Coumadin toxicity precipitated by recent      antibiotics.  2.  Uncontrolled hypertension.  3.  Status post aortic valve replacement.  4.  Non-insulin-dependent diabetes mellitus controlled by diet.  5.  Hypercholesterolemia.  6.  Gastroesophageal reflux disease.   FINAL DIAGNOSES:  1.  1.  Gross hematuria secondary to Coumadin toxicity precipitated by      recent antibiotics.  2.  Uncontrolled hypertension.  3.  Status post aortic valve replacement.  4.  Non-insulin-dependent diabetes mellitus controlled by diet.  5.  Hypercholesterolemia.  6.  Gastroesophageal reflux disease.   DISCHARGE MEDICATIONS:  1.  Coumadin 8 mg, one tablet daily six days a week.  No Coumadin on Sunday.  2.  Toprol-XL 25 mg, one tablet daily.  3.  Altace 10 mg, once capsule twice daily.  4.  Simvastatin 40 mg, one tablet daily at night.  5.  Clonodin 0.2 mg, one tablet daily.  6.  Avapro 300 mg, one tablet daily.  7.  Avodart 0.5 mg, one tablet daily.  8.  Macrobid one daily for 10 days.  9.  Prilosec 20 mg, one twice daily.   DIET:  Low salt, low cholesterol, 1800 calorie ADA diet.  The patient has  been advised to avoid sweets.   ACTIVITY:  As tolerated.   FOLLOW UP:  CBC, urinalysis, and protime in one week.  The patient has been  advised to call me immediately if he notices any blood in the urine or if he  feels dizzy, lightheaded, and weak.  He is to follow up with me and Dr.  Wanda Plump in one week.   CONDITION ON DISCHARGE:  Stable.   HISTORY OF PRESENT ILLNESS:  [name]  is a 77 year old black male with past  medical history significant for aortic stenosis status post metallic aortic  valve replacement, hypertension, hypercholesterolemia, GERD, history of GI  bleeding in the past, non-insulin-dependent diabetes mellitus controlled by  diet.  He came to the ER complaining of blood in the urine since this a.m.  He states that he was started on Flagyl and tetracycline by GI approximately  two weeks ago following upper endoscopy and was noted to have Helicobater  pylori.  He was noted in the ER to have an INR of 10.6.  The patient denies  any chest pain, dizziness, lightheadedness, or syncope.  He denies  palpitations, denies shortness of breath.  He denies bright red blood per  rectum, denies abdominal pain.  Denies fever, chills, cough, or hemoptysis.   PAST MEDICAL HISTORY:  As above.   PAST SURGICAL HISTORY:  Status post AVR.   SOCIAL HISTORY:  He  is widowed.  He worked in a [employer].  No history of  smoking or alcohol abuse.  He was born in [place].  He lives in  [place].   ALLERGIES:  NO KNOWN DRUG ALLERGIES.   CURRENT MEDICATIONS:  1.  Coumadin 8 mg six days per week and 5 mg on Sundays.  His INR was      maintained between 2-3 in the past on this dose.  2.  Toprol-XL 25 mg p.o. daily.  3.  Altace 10 mg p.o. b.i.d.  4.  Nexium 40 mg p.o. daily.  5.  Simvastatin 40 mg p.o. daily.  6.  Clonidine 0.2 mg in the a.m. and a half a tablet in the evening.   PHYSICAL EXAMINATION:  GENERAL:  He was alert and oriented x3 and in no  acute distress.  VITAL SIGNS:  Blood pressure 200/90, pulse 75 and regular.  __________ pink.  NECK:  Supple.  No JVD, no bruit.  LUNGS:  Clear to auscultation without rhonchi or rales.  CARDIOVASCULAR:  S1 and S2 normal.  Metallic heart sounds.  Soft systolic  murmur at left lower sternal border.  ABDOMEN:  Soft.  Bowel sounds present.  Nontender.  EXTREMITIES:  No clubbing, cyanosis, or edema.   LABORATORY DATA:   PSA level ws 0.96 which was normal.  Urinalysis showed a  large amount of blood.  Cholesterol 142, LDL 80, HDL 44.  Sodium 140,  potassium 4.0, chloride 110, glucose 109, BUN 14, creatinine 1.3, total  protein 7.2, albumin 4.1.  His liver enzymes were normal.  His admission PT  was 82.3, INR 10.6.  Repeat PT on July 22, 2005 was 19.8 with an INR of  1.7.  Hemoglobin 12.7, hematocrit 38.7, white count 4.6.  Repeat hemoglobin  on July 26, 2005 was 8.6, hematocrit 26.1, white count 7.0.  Post-  transfusion on July 27, 2005, hemoglobin 9.7, hematocrit 34.8.  On  August 01, 2005, hemoglobin 12, hematocrit 36, white count 6.4 which has  been stable.   HOSPITAL COURSE:  The patient was admitted to the telemetry unit.  The  patient did receive one dose of vitamin K with normalization of his INR.  The patient was restarted on Lovenox and Coumadin to bring the INR into  therapeutic range.  Urologic consultation was obtained, as the patient had a  recurrent episode of hematuria.  The patient underwent cystoscopy by Dr.  Wanda Plump on July 26, 2005 as per procedure report.  The patient  underwent CT of the abdomen and pelvis which showed no evidence of tumor.  The patient's urine gradually cleared up.  The patient was restarted on  Lovenox and Coumadin.  The patient again had an episode of blood-tinged  urine while on Lovenox.  Lovenox was discontinued, and the patient was  maintained on Coumadin.  The patient will be discharged home on the above  medications and will be followed up in my office and with urology in one  week.  The  patient obviously could not tolerate dual anticoagulation.  I discussed at  length with the patient regarding oral Coumadin only at this point to which  the patient agrees and will be discharged home on the above medications and  will be followed up in my office in one week.          ______________________________  [name], M.D.     MNH/MEDQ   D:  08/02/2005  T:  08/02/2005  Job:  [id number]  cc:   [place] Service, M.D.  Fax: [phone]

## 2011-02-18 NOTE — Op Note (Signed)
[name], Brian NO.:  [id number]   MEDICAL RECORD NO.:  [id number]          PATIENT TYPE:  INP   LOCATION:  2010                         FACILITY:  [facility]   PHYSICIAN:  [place] Service, M.D.DATE OF BIRTH:  [birth year]   DATE OF PROCEDURE:  07/26/2005  DATE OF DISCHARGE:                                 OPERATIVE REPORT   PREOPERATIVE DIAGNOSIS:  A 77 year old male, hematuria on Lovenox on  Coumadin, CT scan abdomen and pelvis, films ready by Gerrianne Scale,  M.D., No significant pelvic findings.  No obvious radiological explanation  for hematuria.  Consider cystoscopy.  Scans of the abdomen likewise show,  No explanation for hematuria demonstrated.  Simple cyst in the left kidney,  which is enlarged slightly from prior exam July 2000.  No acute or  significant abdominal findings are seen.  Left adrenal adenoma is  unchanged.   DESCRIPTION OF PROCEDURE:  The patient was prepped and draped in the supine  position after institution of 1 g of Ancef IV and a B&O suppository.  A 24  [place] Ainsworth catheter was then gently inserted at the urethral meatus,  passed without resistance into the bladder.  The bladder was then gently  irrigated over a period of about 20-30 minutes until irrigant was clear.  The catheter was then removed and replaced with the 16 French fiberoptic  cystoscope.  Normal urethra and sphincter.  The patient has hyperemic  prostate.  Careful inspection of the bladder showed clear efflux at the left  orifice, pale pink efflux at the right orifice.  Bladder showed no evidence  of stone or tumor.  The bladder was then filled to capacity.  The flexible  cystoscope was withdrawn.  The 24 [place] Ainsworth was then reinserted, 25  mL in a 30 mL balloon.  It was left to straight drain.  Gratefully  appreciate help of nursing staff on unit 2000.           ______________________________  [place] Service, M.D.     RH/MEDQ  D:  07/26/2005  T:   07/26/2005  Job:  [id number]   cc:   Eduardo Osier. Sharyn Lull, M.D.  Fax: [phone]

## 2011-02-18 NOTE — Op Note (Signed)
[name], HOEFLING NO.:  [id number]   MEDICAL RECORD NO.:  [id number]                   PATIENT TYPE:  INP   LOCATION:  2023                                 FACILITY:  [facility]   PHYSICIAN:  Anselmo Rod, M.D.               DATE OF BIRTH:  [birth year]   DATE OF PROCEDURE:  10/11/2002  DATE OF DISCHARGE:                                 OPERATIVE REPORT   PROCEDURE PERFORMED:  Colonoscopy with snare polypectomy x4.   ENDOSCOPIST:  Anselmo Rod, M.D.   INSTRUMENT USED:  Olympus video colonoscope.   INDICATIONS FOR PROCEDURE:  Melena in a 77 year old African-American male  with a history of aortic valve replacement on Coumadin.  Essentially  unrevealing EGD except for right-sided colonic lesions.   PRE-PROCEDURE PREPARATION:  Informed consent was procured from the patient.  The patient was fasted for eight hours prior to the procedure and was  prepped with a bottle of magnesium citrate and a gallon of NuLytely the  night prior to the procedure.   PRE-PROCEDURE PHYSICAL:  VITAL SIGNS:  The patient had stable vital signs.   NECK:  Supple.   CHEST:  Clear to auscultation.   CARDIAC:  S1, S2 regular.  Metallic clip present on auscultation.  Midline  scar present from previous valve replacement.   ABDOMEN:  Soft with normal bowel sounds.  Nontender with no evidence of  hepatosplenomegaly.  No masses palpable.   DESCRIPTION OF PROCEDURE:  The patient was placed in the left lateral  decubitus position and sedated with 40 mg of Demerol and 4 mg of Versed  intravenously.  Once the patient was adequately sedated and maintained on  low-flow oxygen and continuous cardiac monitoring, the Olympus video  colonoscope was advanced from the rectum to the cecum.  With difficulty,  there was fairly frothy debris in the colon.  Multiple washings were done.  Three small sessile polyps were removed from the left colon.  A large  sessile polyp measuring about  6-7 mm was snared from the rectosigmoid area  at 20 cm.  All of the polyps were sessile and were removed by snare  polypectomy.  Prominent internal hemorrhoids were seen on retroflexion.  No  other abnormalities were noted.  There was no evidence of diverticulosis.   IMPRESSION:  1. Colonic polyps, removed from the left colon and the rectosigmoid area by     snare polypectomy.  2. Prominent internal hemorrhoids.  3. No evidence of diverticulosis.  4. Normal-appearing transverse colon, right colon, and cecum.    RECOMMENDATIONS:  1. Await pathology results.  2. Small bowel follow-through to further evaluate the patient's melena.  3. Serial CBCs.  4. Resume use of heparin.  5. Avoid concomitant nonsteroidals including aspirin use for now.  Anselmo Rod, M.D.    JNM/MEDQ  D:  10/11/2002  T:  10/12/2002  Job:  [id number]   cc:   Eduardo Osier. Sharyn Lull, M.D.  [address]  [place]  [place] [address]  Fax: [phone]

## 2011-07-04 LAB — URINALYSIS, ROUTINE W REFLEX MICROSCOPIC
Bilirubin Urine: NEGATIVE
Ketones, ur: NEGATIVE
Nitrite: NEGATIVE
Protein, ur: NEGATIVE
Specific Gravity, Urine: 1.018
Urobilinogen, UA: 1

## 2011-07-04 LAB — POCT I-STAT, CHEM 8
BUN: 18
Chloride: 108
Creatinine, Ser: 1.2
Glucose, Bld: 125 — ABNORMAL HIGH
Potassium: 4

## 2011-07-04 LAB — URINE MICROSCOPIC-ADD ON

## 2011-07-04 LAB — DIFFERENTIAL
Basophils Absolute: 0
Basophils Relative: 0
Eosinophils Relative: 0
Monocytes Absolute: 0.7

## 2011-07-04 LAB — HEPATIC FUNCTION PANEL
Bilirubin, Direct: 0.3
Indirect Bilirubin: 0.8

## 2011-07-04 LAB — PROTIME-INR
INR: 2.5 — ABNORMAL HIGH
Prothrombin Time: 29.1 — ABNORMAL HIGH

## 2011-07-04 LAB — CBC
HCT: 41.4
Hemoglobin: 13.6
MCHC: 32.7
MCV: 84
RDW: 16.7 — ABNORMAL HIGH

## 2011-07-05 LAB — BASIC METABOLIC PANEL
BUN: 27 — ABNORMAL HIGH
CO2: 28
Chloride: 105
Glucose, Bld: 105 — ABNORMAL HIGH
Potassium: 5

## 2011-07-05 LAB — CBC
HCT: 40.8
MCV: 86.1
Platelets: 237
RDW: 15.4

## 2011-07-05 LAB — PROTIME-INR: Prothrombin Time: 13.6

## 2011-07-05 LAB — APTT: aPTT: 32

## 2011-09-21 ENCOUNTER — Other Ambulatory Visit: Payer: Self-pay | Admitting: Cardiology

## 2011-10-05 ENCOUNTER — Other Ambulatory Visit: Payer: Self-pay | Admitting: Cardiology

## 2011-11-15 ENCOUNTER — Other Ambulatory Visit: Payer: Self-pay | Admitting: Cardiology

## 2011-11-21 ENCOUNTER — Other Ambulatory Visit: Payer: Self-pay | Admitting: Cardiology

## 2011-12-01 ENCOUNTER — Other Ambulatory Visit: Payer: Self-pay | Admitting: Cardiology

## 2012-02-02 ENCOUNTER — Other Ambulatory Visit: Payer: Self-pay | Admitting: Cardiology

## 2012-03-19 ENCOUNTER — Other Ambulatory Visit: Payer: Self-pay | Admitting: Cardiology

## 2012-03-26 ENCOUNTER — Other Ambulatory Visit: Payer: Self-pay | Admitting: Cardiology

## 2012-04-12 ENCOUNTER — Other Ambulatory Visit: Payer: Self-pay | Admitting: Cardiology

## 2012-06-06 ENCOUNTER — Other Ambulatory Visit: Payer: Self-pay | Admitting: Cardiology

## 2012-09-11 ENCOUNTER — Observation Stay (HOSPITAL_COMMUNITY)
Admission: EM | Admit: 2012-09-11 | Discharge: 2012-09-14 | DRG: 379 | Disposition: A | Discharge status: Home / Self Care | Payer: Medicare Other | Attending: Cardiology | Admitting: Cardiology

## 2012-09-11 ENCOUNTER — Encounter (HOSPITAL_COMMUNITY): Payer: Self-pay | Admitting: Emergency Medicine

## 2012-09-11 ENCOUNTER — Emergency Department (HOSPITAL_COMMUNITY): Payer: Medicare Other

## 2012-09-11 ENCOUNTER — Inpatient Hospital Stay (HOSPITAL_COMMUNITY): Payer: Medicare Other

## 2012-09-11 ENCOUNTER — Encounter (HOSPITAL_COMMUNITY)
Admission: EM | Disposition: A | Discharge status: Home / Self Care | Payer: Self-pay | Source: Home / Self Care | Attending: Cardiology

## 2012-09-11 DIAGNOSIS — K219 Gastro-esophageal reflux disease without esophagitis: Secondary | ICD-10-CM | POA: Insufficient documentation

## 2012-09-11 DIAGNOSIS — Z7901 Long term (current) use of anticoagulants: Secondary | ICD-10-CM | POA: Insufficient documentation

## 2012-09-11 DIAGNOSIS — E119 Type 2 diabetes mellitus without complications: Secondary | ICD-10-CM | POA: Insufficient documentation

## 2012-09-11 DIAGNOSIS — D649 Anemia, unspecified: Principal | ICD-10-CM

## 2012-09-11 DIAGNOSIS — E7800 Pure hypercholesterolemia, unspecified: Secondary | ICD-10-CM | POA: Insufficient documentation

## 2012-09-11 DIAGNOSIS — R195 Other fecal abnormalities: Secondary | ICD-10-CM | POA: Insufficient documentation

## 2012-09-11 DIAGNOSIS — Z860100 Personal history of colon polyps, unspecified: Secondary | ICD-10-CM | POA: Insufficient documentation

## 2012-09-11 DIAGNOSIS — R42 Dizziness and giddiness: Secondary | ICD-10-CM | POA: Insufficient documentation

## 2012-09-11 DIAGNOSIS — Z954 Presence of other heart-valve replacement: Secondary | ICD-10-CM | POA: Insufficient documentation

## 2012-09-11 DIAGNOSIS — R1013 Epigastric pain: Secondary | ICD-10-CM | POA: Insufficient documentation

## 2012-09-11 DIAGNOSIS — R0602 Shortness of breath: Secondary | ICD-10-CM | POA: Insufficient documentation

## 2012-09-11 DIAGNOSIS — I2510 Atherosclerotic heart disease of native coronary artery without angina pectoris: Secondary | ICD-10-CM | POA: Insufficient documentation

## 2012-09-11 HISTORY — DX: Atherosclerotic heart disease of native coronary artery without angina pectoris: I25.10

## 2012-09-11 HISTORY — DX: Gastrointestinal hemorrhage, unspecified: K92.2

## 2012-09-11 HISTORY — DX: Presence of prosthetic heart valve: Z95.2

## 2012-09-11 HISTORY — DX: Essential (primary) hypertension: I10

## 2012-09-11 HISTORY — DX: Type 2 diabetes mellitus without complications: E11.9

## 2012-09-11 HISTORY — DX: Personal history of colon polyps, unspecified: Z86.0100

## 2012-09-11 HISTORY — PX: ESOPHAGOGASTRODUODENOSCOPY: SHX5428

## 2012-09-11 HISTORY — DX: Personal history of colonic polyps: Z86.010

## 2012-09-11 HISTORY — DX: Endocarditis, valve unspecified: I38

## 2012-09-11 HISTORY — DX: Gastro-esophageal reflux disease without esophagitis: K21.9

## 2012-09-11 HISTORY — DX: Nonrheumatic aortic (valve) stenosis: I35.0

## 2012-09-11 LAB — CBC WITH DIFFERENTIAL/PLATELET
Eosinophils Relative: 0 % (ref 0–5)
HCT: 22.8 % — ABNORMAL LOW (ref 39.0–52.0)
Lymphocytes Relative: 16 % (ref 12–46)
Lymphs Abs: 1.2 10*3/uL (ref 0.7–4.0)
MCV: 66.9 fL — ABNORMAL LOW (ref 78.0–100.0)
Monocytes Relative: 9 % (ref 3–12)
Platelets: 343 10*3/uL (ref 150–400)
RBC: 3.41 MIL/uL — ABNORMAL LOW (ref 4.22–5.81)
WBC: 7.5 10*3/uL (ref 4.0–10.5)

## 2012-09-11 LAB — CBC
HCT: 21.6 % — ABNORMAL LOW (ref 39.0–52.0)
MCV: 66.9 fL — ABNORMAL LOW (ref 78.0–100.0)
RBC: 3.23 MIL/uL — ABNORMAL LOW (ref 4.22–5.81)
RDW: 16.6 % — ABNORMAL HIGH (ref 11.5–15.5)
WBC: 7.6 10*3/uL (ref 4.0–10.5)

## 2012-09-11 LAB — COMPREHENSIVE METABOLIC PANEL
ALT: 10 U/L (ref 0–53)
AST: 14 U/L (ref 0–37)
AST: 13 U/L (ref 0–37)
Albumin: 3.5 g/dL (ref 3.5–5.2)
Albumin: 3.5 g/dL (ref 3.5–5.2)
Alkaline Phosphatase: 71 U/L (ref 39–117)
BUN: 14 mg/dL (ref 6–23)
CO2: 25 mEq/L (ref 19–32)
Calcium: 9.3 mg/dL (ref 8.4–10.5)
Chloride: 106 mEq/L (ref 96–112)
Creatinine, Ser: 1.18 mg/dL (ref 0.50–1.35)
Creatinine, Ser: 1.07 mg/dL (ref 0.50–1.35)
GFR calc non Af Amer: 58 mL/min — ABNORMAL LOW (ref >90)
Potassium: 3.9 mEq/L (ref 3.5–5.1)
Total Bilirubin: 0.4 mg/dL (ref 0.3–1.2)
Total Protein: 7.3 g/dL (ref 6.0–8.3)

## 2012-09-11 LAB — MAGNESIUM: Magnesium: 1.9 mg/dL (ref 1.5–2.5)

## 2012-09-11 LAB — GLUCOSE, CAPILLARY: Glucose-Capillary: 80 mg/dL (ref 70–99)

## 2012-09-11 LAB — PROTIME-INR
INR: 2.87 — ABNORMAL HIGH (ref 0.00–1.49)
Prothrombin Time: 28.6 seconds — ABNORMAL HIGH (ref 11.6–15.2)

## 2012-09-11 LAB — PREPARE RBC (CROSSMATCH)

## 2012-09-11 LAB — TSH: TSH: 0.468 u[IU]/mL (ref 0.350–4.500)

## 2012-09-11 SURGERY — EGD (ESOPHAGOGASTRODUODENOSCOPY)
Anesthesia: Moderate Sedation

## 2012-09-11 MED ORDER — FINASTERIDE 5 MG PO TABS
5.0000 mg | ORAL_TABLET | Freq: Every day | ORAL | Status: DC
  Administered 2012-09-11 – 2012-09-14 (×4): 5 mg via ORAL
  Filled 2012-09-11 (×4): qty 1

## 2012-09-11 MED ORDER — QUINAPRIL HCL 10 MG PO TABS
10.0000 mg | ORAL_TABLET | Freq: Two times a day (BID) | ORAL | Status: DC
  Administered 2012-09-11 – 2012-09-12 (×4): 10 mg via ORAL
  Filled 2012-09-11 (×6): qty 1

## 2012-09-11 MED ORDER — SODIUM CHLORIDE 0.9 % IV SOLN: INTRAVENOUS | Status: DC

## 2012-09-11 MED ORDER — PANTOPRAZOLE SODIUM 40 MG PO TBEC: 40.0000 mg | DELAYED_RELEASE_TABLET | Freq: Every day | ORAL | Status: DC

## 2012-09-11 MED ORDER — FENTANYL CITRATE 0.05 MG/ML IJ SOLN
INTRAMUSCULAR | Status: DC | PRN
  Administered 2012-09-11 (×3): 25 ug via INTRAVENOUS

## 2012-09-11 MED ORDER — SODIUM CHLORIDE 0.9 % IV SOLN
Freq: Once | INTRAVENOUS | Status: AC
  Administered 2012-09-11: 09:00:00 via INTRAVENOUS

## 2012-09-11 MED ORDER — MIDAZOLAM HCL 10 MG/2ML IJ SOLN
INTRAMUSCULAR | Status: DC | PRN
  Administered 2012-09-11 (×2): 2 mg via INTRAVENOUS
  Administered 2012-09-11: 1 mg via INTRAVENOUS

## 2012-09-11 MED ORDER — SUCRALFATE 1 GM/10ML PO SUSP
1.0000 g | Freq: Four times a day (QID) | ORAL | Status: DC
  Administered 2012-09-11 – 2012-09-14 (×13): 1 g via ORAL
  Filled 2012-09-11 (×15): qty 10

## 2012-09-11 MED ORDER — METOPROLOL TARTRATE 12.5 MG HALF TABLET
12.5000 mg | ORAL_TABLET | Freq: Two times a day (BID) | ORAL | Status: DC
  Administered 2012-09-11 – 2012-09-14 (×7): 12.5 mg via ORAL
  Filled 2012-09-11 (×8): qty 1

## 2012-09-11 MED ORDER — MIDAZOLAM HCL 5 MG/ML IJ SOLN
INTRAMUSCULAR | Status: AC
  Filled 2012-09-11: qty 2

## 2012-09-11 MED ORDER — SODIUM CHLORIDE 0.9 % IV SOLN
INTRAVENOUS | Status: DC
  Administered 2012-09-11: 13:00:00 via INTRAVENOUS

## 2012-09-11 MED ORDER — PANTOPRAZOLE SODIUM 40 MG PO TBEC
80.0000 mg | DELAYED_RELEASE_TABLET | Freq: Every day | ORAL | Status: DC
  Administered 2012-09-11 – 2012-09-14 (×4): 80 mg via ORAL
  Filled 2012-09-11: qty 1

## 2012-09-11 MED ORDER — DOCUSATE SODIUM 100 MG PO CAPS
100.0000 mg | ORAL_CAPSULE | Freq: Two times a day (BID) | ORAL | Status: DC
  Administered 2012-09-11 – 2012-09-14 (×7): 100 mg via ORAL
  Filled 2012-09-11 (×9): qty 1

## 2012-09-11 MED ORDER — SIMVASTATIN 40 MG PO TABS
40.0000 mg | ORAL_TABLET | Freq: Every evening | ORAL | Status: DC
  Administered 2012-09-12: 40 mg via ORAL
  Filled 2012-09-11 (×3): qty 1

## 2012-09-11 MED ORDER — QUINAPRIL HCL 10 MG PO TABS: 10.0000 mg | ORAL_TABLET | Freq: Two times a day (BID) | ORAL | Status: DC

## 2012-09-11 MED ORDER — FENTANYL CITRATE 0.05 MG/ML IJ SOLN
INTRAMUSCULAR | Status: AC
  Filled 2012-09-11: qty 2

## 2012-09-11 NOTE — Progress Notes (Signed)
Pt BP is 187/62 prior to blood administration, called Dr Sharyn Lull and it is ok to give the blood and also BP medications( metoprolol and quinapril)

## 2012-09-11 NOTE — Consult Note (Signed)
Reason for Consult: Anemia, Heme positive stool, and Epigastric pain Referring Physician: Rinaldo Cloud, M.D.  [name] HPI: This is a 78 year old male who presents to the ER with complaints of feeling weak, dizzy, and having epigastric pain.  His symptoms started 1 week ago and it progressively worsened.  When he presented to the ER his HGB was noted to be in the 7 range and his prior HGB was in the 9 range.  He is therapeutic with his INR.  No reports of hematochezia, melena, diarrhea, or constipation.  Last year he underwent extensive GI work up with an EGD/Colonoscopy and capsule endoscopy.  The finding were negative for any overt bleeding site.  Past Medical History  Diagnosis Date  . Heart valve disease     History reviewed. No pertinent past surgical history.  History reviewed. No pertinent family history.  Social History:  reports that he has never smoked. He does not have any smokeless tobacco history on file. He reports that he does not drink alcohol or use illicit drugs.  Allergies: No Known Allergies  Medications:  Scheduled:   . [COMPLETED] sodium chloride   Intravenous Once  . [facility] HOLD] docusate sodium  100 mg Oral BID  . [MAR HOLD] finasteride  5 mg Oral Daily  . [facility] HOLD] metoprolol tartrate  12.5 mg Oral BID  . [facility] HOLD] pantoprazole  80 mg Oral Daily  . [MAR HOLD] quinapril  10 mg Oral BID  . [facility] HOLD] simvastatin  40 mg Oral QPM  . [MAR HOLD] sucralfate  1 g Oral QID  . [DISCONTINUED] pantoprazole  40 mg Oral Daily  . [DISCONTINUED] quinapril  10 mg Oral BID   Continuous:   . sodium chloride 50 mL/hr at 09/11/12 1320  . sodium chloride      Results for orders placed during the hospital encounter of 09/11/12 (from the past 24 hour(s))  CBC WITH DIFFERENTIAL     Status: Abnormal   Collection Time   09/11/12  7:18 AM      Component Value Range   WBC 7.5  4.0 - 10.5 K/uL   RBC 3.41 (*) 4.22 - 5.81 MIL/uL   Hemoglobin 7.1 (*) 13.0 - 17.0 g/dL   HCT 62.9 (*) 52.8 - 41.3 %   MCV 66.9 (*) 78.0 - 100.0 fL   MCH 20.8 (*) 26.0 - 34.0 pg   MCHC 31.1  30.0 - 36.0 g/dL   RDW 24.4 (*) 01.0 - 27.2 %   Platelets 343  150 - 400 K/uL   Neutrophils Relative 74  43 - 77 %   Lymphocytes Relative 16  12 - 46 %   Monocytes Relative 9  3 - 12 %   Eosinophils Relative 0  0 - 5 %   Basophils Relative 1  0 - 1 %   Neutro Abs 5.5  1.7 - 7.7 K/uL   Lymphs Abs 1.2  0.7 - 4.0 K/uL   Monocytes Absolute 0.7  0.1 - 1.0 K/uL   Eosinophils Absolute 0.0  0.0 - 0.7 K/uL   Basophils Absolute 0.1  0.0 - 0.1 K/uL   RBC Morphology TARGET CELLS    COMPREHENSIVE METABOLIC PANEL     Status: Abnormal   Collection Time   09/11/12  7:18 AM      Component Value Range   Sodium 140  135 - 145 mEq/L   Potassium 3.9  3.5 - 5.1 mEq/L   Chloride 106  96 - 112 mEq/L  CO2 25  19 - 32 mEq/L   Glucose, Bld 116 (*) 70 - 99 mg/dL   BUN 16  6 - 23 mg/dL   Creatinine, Ser 4.09  0.50 - 1.35 mg/dL   Calcium 9.4  8.4 - 81.1 mg/dL   Total Protein 7.4  6.0 - 8.3 g/dL   Albumin 3.5  3.5 - 5.2 g/dL   AST 14  0 - 37 U/L   ALT 10  0 - 53 U/L   Alkaline Phosphatase 71  39 - 117 U/L   Total Bilirubin 0.4  0.3 - 1.2 mg/dL   GFR calc non Af Amer 58 (*) >90 mL/min   GFR calc Af Amer 67 (*) >90 mL/min  PROTIME-INR     Status: Abnormal   Collection Time   09/11/12  7:18 AM      Component Value Range   Prothrombin Time 28.6 (*) 11.6 - 15.2 seconds   INR 2.87 (*) 0.00 - 1.49  TROPONIN I     Status: Normal   Collection Time   09/11/12  7:18 AM      Component Value Range   Troponin I <0.30  <0.30 ng/mL  TYPE AND SCREEN     Status: Normal (Preliminary result)   Collection Time   09/11/12  9:10 AM      Component Value Range   ABO/RH(D) A POS     Antibody Screen NEG     Sample Expiration 09/14/2012     Unit Number [id number]     Blood Component Type RED CELLS,LR     Unit division 00     Status of Unit ISSUED     Transfusion Status OK TO TRANSFUSE     Crossmatch Result  Compatible     Unit Number [id number]     Blood Component Type RED CELLS,LR     Unit division 00     Status of Unit ALLOCATED     Transfusion Status OK TO TRANSFUSE     Crossmatch Result Compatible    PREPARE RBC (CROSSMATCH)     Status: Normal   Collection Time   09/11/12  9:10 AM      Component Value Range   Order Confirmation ORDER PROCESSED BY BLOOD BANK    COMPREHENSIVE METABOLIC PANEL     Status: Abnormal   Collection Time   09/11/12  1:00 PM      Component Value Range   Sodium 140  135 - 145 mEq/L   Potassium 3.5  3.5 - 5.1 mEq/L   Chloride 105  96 - 112 mEq/L   CO2 24  19 - 32 mEq/L   Glucose, Bld 102 (*) 70 - 99 mg/dL   BUN 14  6 - 23 mg/dL   Creatinine, Ser 8.41  0.50 - 1.35 mg/dL   Calcium 9.3  8.4 - 32.4 mg/dL   Total Protein 7.3  6.0 - 8.3 g/dL   Albumin 3.5  3.5 - 5.2 g/dL   AST 13  0 - 37 U/L   ALT 10  0 - 53 U/L   Alkaline Phosphatase 68  39 - 117 U/L   Total Bilirubin 0.5  0.3 - 1.2 mg/dL   GFR calc non Af Amer 65 (*) >90 mL/min   GFR calc Af Amer 75 (*) >90 mL/min  MAGNESIUM     Status: Normal   Collection Time   09/11/12  1:00 PM      Component Value Range   Magnesium 1.9  1.5 -  2.5 mg/dL  CBC     Status: Abnormal   Collection Time   09/11/12  1:00 PM      Component Value Range   WBC 7.6  4.0 - 10.5 K/uL   RBC 3.23 (*) 4.22 - 5.81 MIL/uL   Hemoglobin 6.8 (*) 13.0 - 17.0 g/dL   HCT 16.1 (*) 09.6 - 04.5 %   MCV 66.9 (*) 78.0 - 100.0 fL   MCH 21.1 (*) 26.0 - 34.0 pg   MCHC 31.5  30.0 - 36.0 g/dL   RDW 40.9 (*) 81.1 - 91.4 %   Platelets 329  150 - 400 K/uL  APTT     Status: Normal   Collection Time   09/11/12  1:00 PM      Component Value Range   aPTT 37  24 - 37 seconds     Dg Chest 2 View  09/11/2012  *RADIOLOGY REPORT*  Clinical Data: Chest pain, weakness, dizziness  CHEST - 2 VIEW  Comparison: 11/23/2010; 08/14/2008  Findings:  Grossly unchanged cardiac silhouette and mediastinal contours with atherosclerotic calcifications within the  aortic arch.  Post median sternotomy and aortic valve replacement.  The lungs are hyperexpanded with flattening of bilateral hemidiaphragms and mild diffuse thickening of the pulmonary interstitium.  There is mild eventration of the right hemidiaphragm.  No new focal airspace opacities.  No definite pleural effusion or pneumothorax.  Grossly unchanged bones.  IMPRESSION: Hyperexpanded lungs without acute cardiopulmonary disease.   Original Report Authenticated By: Tacey Ruiz, MD    Dg Abd Portable 1v  09/11/2012  *RADIOLOGY REPORT*  Clinical Data: Abdominal pain.  PORTABLE ABDOMEN - 1 VIEW  Comparison: 11/23/2010.  Findings: There is air throughout the large and small bowel consistent with diffuse ileus.  No findings for obstruction or free air.  IMPRESSION: Diffuse ileus bowel gas pattern.   Original Report Authenticated By: Rudie Meyer, M.D.     ROS:  As stated above in the HPI otherwise negative.  Blood pressure 185/73, pulse 79, temperature 98.4 F (36.9 C), temperature source Oral, resp. rate 18, height 5\' 11"  (1.803 m), weight 88.451 kg (195 lb), SpO2 100.00%.    PE: Gen: NAD, Alert and Oriented HEENT:  [place]/AT, EOMI Neck: Supple, no LAD Lungs: CTA Bilaterally CV: RRR without M/G/R ABM: Soft, mild epigastric tenderness, +BS Ext: No C/C/E  Assessment/Plan: 1) Anemia. 2) Heme positive stool. 3) Epigastric tenderness.   I will perform an endoscopic evaluation.  Per my prior recommendation I will perform an enteroscopy at this time.  Hopefully a site for bleeding can be identified.  Plan: 1) Enteroscopy.  [name] D 09/11/2012, 3:29 PM

## 2012-09-11 NOTE — Interval H&P Note (Signed)
History and Physical Interval Note:  09/11/2012 3:40 PM  [name]  has presented today for surgery, with the diagnosis of Anemia and heme positive stool  The various methods of treatment have been discussed with the patient and family. After consideration of risks, benefits and other options for treatment, the patient has consented to  Procedure(s) (LRB) with comments: ESOPHAGOGASTRODUODENOSCOPY (EGD) (N/A) as a surgical intervention .  The patient's history has been reviewed, patient examined, no change in status, stable for surgery.  I have reviewed the patient's chart and labs.  Questions were answered to the patient's satisfaction.     [name] D

## 2012-09-11 NOTE — H&P (Signed)
[name] is an 78 y.o. male.   Chief Complaint: Abdominal pain associated with dizziness and weakness HPI: Patient is 78 year old male with past medical history significant for mild coronary artery disease history of severe aortic stenosis status post aVR on chronic anticoagulation, hypertension, hypercholesteremia, GERD, history of colonic polyps, non-insulin-dependent diabetes mellitus controlled by diet, GERD, history of mild gastritis, history of GI bleeding in the past, came to the ER complaining of vague abdominal pain associated with dizziness and weakness for last few days and was noted to have heme-positive stool and hemoglobin of 7.1. Patient was also noted to have orthostatic hypotension. Patient denies any chest pain nausea vomiting denies any black tarry stools denies any hematuria denies any bright red blood per rectum. Denies hemoptysis or hematemesis.  Past Medical History  Diagnosis Date  . Heart valve disease     History reviewed. No pertinent past surgical history.  History reviewed. No pertinent family history. Social History:  reports that he has never smoked. He does not have any smokeless tobacco history on file. He reports that he does not drink alcohol or use illicit drugs.  Allergies: No Known Allergies   (Not in a hospital admission)  Results for orders placed during the hospital encounter of 09/11/12 (from the past 48 hour(s))  CBC WITH DIFFERENTIAL     Status: Abnormal   Collection Time   09/11/12  7:18 AM      Component Value Range Comment   WBC 7.5  4.0 - 10.5 K/uL    RBC 3.41 (*) 4.22 - 5.81 MIL/uL    Hemoglobin 7.1 (*) 13.0 - 17.0 g/dL REPEATED TO VERIFY   HCT 22.8 (*) 39.0 - 52.0 %    MCV 66.9 (*) 78.0 - 100.0 fL    MCH 20.8 (*) 26.0 - 34.0 pg    MCHC 31.1  30.0 - 36.0 g/dL    RDW 16.1 (*) 09.6 - 15.5 %    Platelets 343  150 - 400 K/uL    Neutrophils Relative 74  43 - 77 %    Lymphocytes Relative 16  12 - 46 %    Monocytes Relative 9  3 - 12 %     Eosinophils Relative 0  0 - 5 %    Basophils Relative 1  0 - 1 %    Neutro Abs 5.5  1.7 - 7.7 K/uL    Lymphs Abs 1.2  0.7 - 4.0 K/uL    Monocytes Absolute 0.7  0.1 - 1.0 K/uL    Eosinophils Absolute 0.0  0.0 - 0.7 K/uL    Basophils Absolute 0.1  0.0 - 0.1 K/uL    RBC Morphology TARGET CELLS   POLYCHROMASIA PRESENT  COMPREHENSIVE METABOLIC PANEL     Status: Abnormal   Collection Time   09/11/12  7:18 AM      Component Value Range Comment   Sodium 140  135 - 145 mEq/L    Potassium 3.9  3.5 - 5.1 mEq/L    Chloride 106  96 - 112 mEq/L    CO2 25  19 - 32 mEq/L    Glucose, Bld 116 (*) 70 - 99 mg/dL    BUN 16  6 - 23 mg/dL    Creatinine, Ser 0.45  0.50 - 1.35 mg/dL    Calcium 9.4  8.4 - 40.9 mg/dL    Total Protein 7.4  6.0 - 8.3 g/dL    Albumin 3.5  3.5 - 5.2 g/dL    AST 14  0 - 37 U/L    ALT 10  0 - 53 U/L    Alkaline Phosphatase 71  39 - 117 U/L    Total Bilirubin 0.4  0.3 - 1.2 mg/dL    GFR calc non Af Amer 58 (*) >90 mL/min    GFR calc Af Amer 67 (*) >90 mL/min   PROTIME-INR     Status: Abnormal   Collection Time   09/11/12  7:18 AM      Component Value Range Comment   Prothrombin Time 28.6 (*) 11.6 - 15.2 seconds    INR 2.87 (*) 0.00 - 1.49   TROPONIN I     Status: Normal   Collection Time   09/11/12  7:18 AM      Component Value Range Comment   Troponin I <0.30  <0.30 ng/mL    Dg Chest 2 View  09/11/2012  *RADIOLOGY REPORT*  Clinical Data: Chest pain, weakness, dizziness  CHEST - 2 VIEW  Comparison: 11/23/2010; 08/14/2008  Findings:  Grossly unchanged cardiac silhouette and mediastinal contours with atherosclerotic calcifications within the aortic arch.  Post median sternotomy and aortic valve replacement.  The lungs are hyperexpanded with flattening of bilateral hemidiaphragms and mild diffuse thickening of the pulmonary interstitium.  There is mild eventration of the right hemidiaphragm.  No new focal airspace opacities.  No definite pleural effusion or pneumothorax.   Grossly unchanged bones.  IMPRESSION: Hyperexpanded lungs without acute cardiopulmonary disease.   Original Report Authenticated By: Tacey Ruiz, MD     Review of Systems  Constitutional: Negative for fever and chills.  Eyes: Negative for blurred vision and double vision.  Respiratory: Positive for shortness of breath. Negative for cough, hemoptysis and sputum production.   Cardiovascular: Negative for chest pain, palpitations, orthopnea, claudication and leg swelling.  Gastrointestinal: Positive for abdominal pain. Negative for nausea and vomiting.  Skin: Negative for rash.  Neurological: Positive for dizziness. Negative for headaches.    Blood pressure 125/53, pulse 101, temperature 97.4 F (36.3 C), temperature source Oral, resp. rate 18, SpO2 100.00%. Physical Exam  Constitutional: He is oriented to person, place, and time. He appears well-developed and well-nourished.  HENT:  Head: Normocephalic and atraumatic.  Eyes: Conjunctivae normal are normal. Pupils are equal, round, and reactive to light. Left eye exhibits no discharge. No scleral icterus.  Neck: Normal range of motion. Neck supple. No JVD present. No tracheal deviation present. No thyromegaly present.  Cardiovascular: Normal rate and regular rhythm.   Murmur (soft systolic murmur noted metallic heart sounds) heard. Respiratory: Effort normal and breath sounds normal. No respiratory distress. He has no wheezes. He has no rales.  GI: Soft. Bowel sounds are normal. He exhibits no distension. There is tenderness. There is no rebound and no guarding.  Musculoskeletal: He exhibits no edema and no tenderness.  Neurological: He is alert and oriented to person, place, and time.     Assessment/Plan Acute on chronic anemia rule out GI loss Status post aVR for severe aortic stenosis Mild coronary artery disease Hypertension Non-insulin-dependent diabetes matters controlled by diet Hypercholesteremia History of colonic  polyp GERD History of mild gastritis in the past  Plan As per orders Hold Coumadin Type and cross match and transfuse 2 units of packed RBCs GI consult  Falicity Sheets N 09/11/2012, 9:55 AM

## 2012-09-11 NOTE — ED Provider Notes (Addendum)
History     CSN: [id number]  Arrival date & time 09/11/12  4696   First MD Initiated Contact with Patient 09/11/12 [phone]      Chief Complaint  Patient presents with  . Shortness of Breath    (Consider location/radiation/quality/duration/timing/severity/associated sxs/prior treatment) Patient is a 78 y.o. male presenting with shortness of breath. The history is provided by the patient. No language interpreter was used.  Shortness of Breath  The current episode started today. The onset was gradual. The problem occurs occasionally. The problem has been gradually worsening. The problem is moderate. Nothing relieves the symptoms. Nothing aggravates the symptoms. Associated symptoms include shortness of breath. He has not inhaled smoke recently. He has had no prior hospitalizations. His past medical history does not include asthma. Urine output has been normal. He has received no recent medical care.   Pt complains of dizziness for 1 week.   Pt reports he feels tired.   Pt has felt similar when he had bleeding internally from his coumadin in the past Past Medical History  Diagnosis Date  . Heart valve disease     History reviewed. No pertinent past surgical history.  No family history on file.  History  Substance Use Topics  . Smoking status: Never Smoker   . Smokeless tobacco: Not on file  . Alcohol Use: No      Review of Systems  Respiratory: Positive for shortness of breath.   All other systems reviewed and are negative.    Allergies  Review of patient's allergies indicates no known allergies.  Home Medications  No current outpatient prescriptions on file.  BP 153/61  Pulse 110  Temp 97.4 F (36.3 C) (Oral)  Resp 18  SpO2 100%  Physical Exam  Nursing note and vitals reviewed. Constitutional: He appears well-developed and well-nourished.  HENT:  Head: Normocephalic and atraumatic.  Right Ear: External ear normal.  Left Ear: External ear normal.  Nose: Nose  normal.  Mouth/Throat: Oropharynx is clear and moist.  Eyes: Conjunctivae normal are normal. Pupils are equal, round, and reactive to light.  Neck: Normal range of motion. Neck supple.  Cardiovascular: Normal rate and normal heart sounds.   Pulmonary/Chest: Effort normal and breath sounds normal.  Abdominal: Soft. Bowel sounds are normal.  Musculoskeletal: Normal range of motion.  Neurological: He is alert.  Skin: Skin is warm.  Psychiatric: He has a normal mood and affect.    ED Course  Procedures (including critical care time)  Labs Reviewed - No data to display No results found.   No diagnosis found.  Hemocult positive  MDM  Pt's hemoglobin is 7.1.   Type and cross ordered for pt.  I spoke to Dr. Sharyn Lull who will see pt here for admission.      Date: 09/11/2012  Rate: 79  Rhythm: normal sinus rhythm  QRS Axis: normal  Intervals: normal  ST/T Wave abnormalities: nonspecific ST changes  Conduction Disutrbances:none  Narrative Interpretation:   Old EKG Reviewed: none available    Elson Areas, PA 09/11/12 0930  Elson Areas, [place] 09/11/12 1039

## 2012-09-11 NOTE — Op Note (Signed)
[facility] [address] [place], [address]   OPERATIVE PROCEDURE REPORT  PATIENT: Cartez, Mogle  MR#: [id number] BIRTHDATE: [birth year]  GENDER: Male ENDOSCOPIST: Jeani Hawking, MD ASSISTANT:   Beryle Beams, technician and Darral Dash, RN PROCEDURE DATE: 09/11/2012 PROCEDURE:   Enteroscopy ASA CLASS:   Class III INDICATIONS:Anemia. MEDICATIONS: Versed 5 mg IV and Fentanyl 75 mcg IV TOPICAL ANESTHETIC:  DESCRIPTION OF PROCEDURE:   After the risks benefits and alternatives of the procedure were thoroughly explained, informed consent was obtained.  The [id number] ([id number])  endoscope was introduced through the mouth  and advanced to the fourth portion of the duodenum Without limitations.      The instrument was slowly withdrawn as the mucosa was fully examined.    FINDINGS: The upper, middle and distal third of the esophagus were carefully inspected and no abnormalities were noted.  The z-line was well seen at the GEJ.  The endoscope was pushed into the fundus which was normal including a retroflexed view.  The antrum, gastric body, first and fourth part of the duodenum were unremarkable. Some red material was noted inthe gastric lumen, but it turned out be from something the patient ate.  Retroflexed views revealed no abnormalities.     The scope was then withdrawn from the patient and the procedure terminated.  COMPLICATIONS: There were no complications. IMPRESSION:Normal enteroscopy.  RECOMMENDATIONS: 1) Follow HGB. 2) Transfuse as necessary. 3) ? Repeat colonoscopy.  _______________________________ eSignedJeani Hawking, MD 09/11/2012 4:23 PM

## 2012-09-11 NOTE — ED Provider Notes (Signed)
Medical screening examination/treatment/procedure(s) were conducted as a shared visit with non-physician practitioner(s) and myself.  I personally evaluated the patient during the encounter  Stable vitals signs.  Pt with gradually worsening dizziness, SOB with exertion.  Pt's Hgb is 7.1, prior was 9.8 in feb 2012.  Pt with h/o GI bleed in the past, sees Dr. Loreta Ave as well as PCP Dr. Sharyn Lull.  Will benefit from transfusion for symptomatic anemia.  Needs hemoccult testing and admission.        Gavin Pound. Oletta Lamas, MD 09/11/12 [phone]

## 2012-09-11 NOTE — ED Notes (Signed)
PT. REPORTS SOB WITH DIZZINESS ONSET LAST WEEK , DENIES COUGH OR CONGESTION . NO FEVER OR CHILLS.

## 2012-09-11 NOTE — ED Notes (Signed)
Pt states that he has been noticing that his is SOB in the morning. Pt states that when he walks he becomes more SOB, pt denies Pain with inspiration or swelling bilateral legs. Pt denies cough.

## 2012-09-12 ENCOUNTER — Encounter (HOSPITAL_COMMUNITY): Payer: Self-pay | Admitting: Gastroenterology

## 2012-09-12 LAB — TYPE AND SCREEN
ABO/RH(D): A POS
Antibody Screen: NEGATIVE
Unit division: 0
Unit division: 0

## 2012-09-12 LAB — HEMOGLOBIN
Hemoglobin: 8.4 g/dL — ABNORMAL LOW (ref 13.0–17.0)
Hemoglobin: 9 g/dL — ABNORMAL LOW (ref 13.0–17.0)

## 2012-09-12 LAB — URINALYSIS, ROUTINE W REFLEX MICROSCOPIC
Bilirubin Urine: NEGATIVE
Hgb urine dipstick: NEGATIVE
Ketones, ur: NEGATIVE mg/dL
Nitrite: NEGATIVE
pH: 6 (ref 5.0–8.0)

## 2012-09-12 LAB — CBC
Hemoglobin: 9.4 g/dL — ABNORMAL LOW (ref 13.0–17.0)
MCH: 22.4 pg — ABNORMAL LOW (ref 26.0–34.0)
MCHC: 32.2 g/dL (ref 30.0–36.0)
Platelets: 309 10*3/uL (ref 150–400)
RBC: 4.19 MIL/uL — ABNORMAL LOW (ref 4.22–5.81)

## 2012-09-12 LAB — BASIC METABOLIC PANEL
BUN: 12 mg/dL (ref 6–23)
Calcium: 9.2 mg/dL (ref 8.4–10.5)
GFR calc Af Amer: 76 mL/min — ABNORMAL LOW (ref >90)
GFR calc non Af Amer: 66 mL/min — ABNORMAL LOW (ref >90)
Glucose, Bld: 98 mg/dL (ref 70–99)
Potassium: 3.6 mEq/L (ref 3.5–5.1)
Sodium: 140 mEq/L (ref 135–145)

## 2012-09-12 LAB — URINE MICROSCOPIC-ADD ON

## 2012-09-12 LAB — PROTIME-INR
INR: 2.35 — ABNORMAL HIGH (ref 0.00–1.49)
Prothrombin Time: 24.7 seconds — ABNORMAL HIGH (ref 11.6–15.2)

## 2012-09-12 LAB — HEMATOCRIT
HCT: 25.7 % — ABNORMAL LOW (ref 39.0–52.0)
HCT: 27.8 % — ABNORMAL LOW (ref 39.0–52.0)

## 2012-09-12 MED ORDER — AMLODIPINE BESYLATE 5 MG PO TABS
5.0000 mg | ORAL_TABLET | Freq: Every day | ORAL | Status: DC
  Administered 2012-09-12 – 2012-09-14 (×3): 5 mg via ORAL
  Filled 2012-09-12 (×3): qty 1

## 2012-09-12 NOTE — Care Management Note (Unsigned)
    Page 1 of 1   09/12/2012     12:05:56 PM   CARE MANAGEMENT NOTE 09/12/2012  Patient:  Regula,Overton   Account Number:  [id number]  Date Initiated:  09/12/2012  Documentation initiated by:  GRAVES-BIGELOW,Micahel Omlor  Subjective/Objective Assessment:   Pt admitted with dizziness, SOB with exertion. S/p transfusion for symptomatic anemia.     Action/Plan:   CM will continue to monitor for disposition needs.   Anticipated DC Date:  09/12/2012   Anticipated DC Plan:  HOME W HOME HEALTH SERVICES         Choice offered to / List presented to:             Status of service:  In process, will continue to follow Medicare Important Message given?   (If response is "NO", the following Medicare IM given date fields will be blank) Date Medicare IM given:   Date Additional Medicare IM given:    Discharge Disposition:    Per UR Regulation:  Reviewed for med. necessity/level of care/duration of stay  If discussed at Long Length of Stay Meetings, dates discussed:    Comments:

## 2012-09-12 NOTE — Progress Notes (Signed)
UR Completed Cortnee Steinmiller Graves-Bigelow, RN,BSN [phone]  

## 2012-09-12 NOTE — Progress Notes (Signed)
Subjective: Events since admission noted. Patient complains of mild constipation. No BM in 2 days. Mild abdominal discomfort from gas and bloating.   Objective: Vital signs in last 24 hours: Temp:  [98 F (36.7 C)-98.8 F (37.1 C)] 98.6 F (37 C) (12/11 1400) Pulse Rate:  [69-85] 71  (12/11 1400) Resp:  [16-20] 18  (12/11 1400) BP: (136-184)/(53-82) 183/66 mmHg (12/11 1400) SpO2:  [99 %-100 %] 99 % (12/11 1400) Weight:  [83.462 kg (184 lb)] 83.462 kg (184 lb) (12/11 0500) Last BM Date: 09/11/12  Intake/Output from previous day: 12/10 0701 - 12/11 0700 In: 1800 [I.V.:750; Blood:1050] Out: 650 [Urine:650] Intake/Output this shift: Total I/O In: 720 [P.O.:720] Out: 850 [Urine:850]  General appearance: alert, cooperative, appears stated age and no distress Resp: clear to auscultation bilaterally Cardio: regular rate and rhythm, S1, S2 normal, systolic murmur present with metallic click Abdomen is soft, non-tender; bowel sounds hypoactive; no masses,  no organomegaly Extremities: extremities normal, atraumatic, no cyanosis or edema  Lab Results:  Basename 09/12/12 1156 09/12/12 0535 09/12/12 0016 09/11/12 1300 09/11/12 0718  WBC -- 10.1 -- 7.6 7.5  HGB 9.0* 9.4* 8.4* -- --  HCT 27.8* 29.2* 25.7* -- --  PLT -- 309 -- 329 343   BMET  Basename 09/12/12 0535 09/11/12 1300 09/11/12 0718  NA 140 140 140  K 3.6 3.5 3.9  CL 107 105 106  CO2 24 24 25   GLUCOSE 98 102* 116*  BUN 12 14 16   CREATININE 1.06 1.07 1.18  CALCIUM 9.2 9.3 9.4   LFT  Basename 09/11/12 1300  PROT 7.3  ALBUMIN 3.5  AST 13  ALT 10  ALKPHOS 68  BILITOT 0.5  BILIDIR --  IBILI --   PT/INR  Basename 09/12/12 0535 09/11/12 0718  LABPROT 24.7* 28.6*  INR 2.35* 2.87*   Studies/Results: Dg Chest 2 View  09/11/2012  *RADIOLOGY REPORT*  Clinical Data: Chest pain, weakness, dizziness  CHEST - 2 VIEW  Comparison: 11/23/2010; 08/14/2008  Findings:  Grossly unchanged cardiac silhouette and mediastinal  contours with atherosclerotic calcifications within the aortic arch.  Post median sternotomy and aortic valve replacement.  The lungs are hyperexpanded with flattening of bilateral hemidiaphragms and mild diffuse thickening of the pulmonary interstitium.  There is mild eventration of the right hemidiaphragm.  No new focal airspace opacities.  No definite pleural effusion or pneumothorax.  Grossly unchanged bones.  IMPRESSION: Hyperexpanded lungs without acute cardiopulmonary disease.   Original Report Authenticated By: Tacey Ruiz, MD    Dg Abd Portable 1v  09/11/2012  *RADIOLOGY REPORT*  Clinical Data: Abdominal pain.  PORTABLE ABDOMEN - 1 VIEW  Comparison: 11/23/2010.  Findings: There is air throughout the large and small bowel consistent with diffuse ileus.  No findings for obstruction or free air.  IMPRESSION: Diffuse ileus bowel gas pattern.   Original Report Authenticated By: Rudie Meyer, M.D.     Medications: I have reviewed the patient's current medications.  Assessment/Plan: 1) Anemia of chronic disease/Prosthetic valve: proceed with anticoagulation as needed. I do not think the patient needs another colonoscopy as the one he had a year ago did not reveal any source of blood loss. His blood counts and stool guaiacs will need to be monitored closely.  2) Constipation: use Colace as needed. 3) Epigastric pain: improved on PPI's.  LOS: 1 day   [name] 09/12/2012, 6:22 PM

## 2012-09-12 NOTE — Progress Notes (Signed)
Subjective:  Patient denies any chest pain or shortness of breath denies any dizziness. No BM yet. Appreciate GI consult and help. Patient will need chronic anticoagulation due to metallic prosthetic aortic valve.  Objective:  Vital Signs in the last 24 hours: Temp:  [98 F (36.7 C)-99.2 F (37.3 C)] 98.5 F (36.9 C) (12/11 0500) Pulse Rate:  [59-100] 72  (12/11 1018) Resp:  [14-68] 18  (12/11 0500) BP: (121-205)/(53-89) 184/82 mmHg (12/11 1018) SpO2:  [98 %-100 %] 99 % (12/11 0500) Weight:  [83.462 kg (184 lb)-88.451 kg (195 lb)] 83.462 kg (184 lb) (12/11 0500)  Intake/Output from previous day: 12/10 0701 - 12/11 0700 In: 1800 [I.V.:750; Blood:1050] Out: 650 [Urine:650] Intake/Output from this shift: Total I/O In: 360 [P.O.:360] Out: -   Physical Exam: Neck: no adenopathy, no carotid bruit, no JVD and supple, symmetrical, trachea midline Lungs: clear to auscultation bilaterally Heart: regular rate and rhythm, S1, S2 normal and Soft systolic murmur and metallic heart sounds noted Abdomen: soft, non-tender; bowel sounds normal; no masses,  no organomegaly Extremities: extremities normal, atraumatic, no cyanosis or edema  Lab Results:  Basename 09/12/12 0535 09/12/12 0016 09/11/12 1300  WBC 10.1 -- 7.6  HGB 9.4* 8.4* --  PLT 309 -- 329    Basename 09/12/12 0535 09/11/12 1300  NA 140 140  K 3.6 3.5  CL 107 105  CO2 24 24  GLUCOSE 98 102*  BUN 12 14  CREATININE 1.06 1.07    Basename 09/11/12 0718  TROPONINI <0.30   Hepatic Function Panel  Basename 09/11/12 1300  PROT 7.3  ALBUMIN 3.5  AST 13  ALT 10  ALKPHOS 68  BILITOT 0.5  BILIDIR --  IBILI --   No results found for this basename: CHOL in the last 72 hours No results found for this basename: PROTIME in the last 72 hours  Imaging: Imaging results have been reviewed and Dg Chest 2 View  09/11/2012  *RADIOLOGY REPORT*  Clinical Data: Chest pain, weakness, dizziness  CHEST - 2 VIEW  Comparison:  11/23/2010; 08/14/2008  Findings:  Grossly unchanged cardiac silhouette and mediastinal contours with atherosclerotic calcifications within the aortic arch.  Post median sternotomy and aortic valve replacement.  The lungs are hyperexpanded with flattening of bilateral hemidiaphragms and mild diffuse thickening of the pulmonary interstitium.  There is mild eventration of the right hemidiaphragm.  No new focal airspace opacities.  No definite pleural effusion or pneumothorax.  Grossly unchanged bones.  IMPRESSION: Hyperexpanded lungs without acute cardiopulmonary disease.   Original Report Authenticated By: Tacey Ruiz, MD    Dg Abd Portable 1v  09/11/2012  *RADIOLOGY REPORT*  Clinical Data: Abdominal pain.  PORTABLE ABDOMEN - 1 VIEW  Comparison: 11/23/2010.  Findings: There is air throughout the large and small bowel consistent with diffuse ileus.  No findings for obstruction or free air.  IMPRESSION: Diffuse ileus bowel gas pattern.   Original Report Authenticated By: Rudie Meyer, M.D.     Cardiac Studies:  Assessment/Plan:  Acute on chronic anemia rule out GI loss  Status post aVR for severe aortic stenosis  Mild coronary artery disease  Hypertension  Non-insulin-dependent diabetes matters controlled by diet  Hypercholesteremia  History of colonic polyp  GERD  History of mild gastritis in the past  Plan Continue present management Will discuss with GI regarding colonoscopy regarding restarting Coumadin  LOS: 1 day    Oriyah Lamphear N 09/12/2012, 12:22 PM

## 2012-09-13 LAB — BASIC METABOLIC PANEL
BUN: 10 mg/dL (ref 6–23)
CO2: 26 mEq/L (ref 19–32)
Calcium: 9.2 mg/dL (ref 8.4–10.5)
Glucose, Bld: 101 mg/dL — ABNORMAL HIGH (ref 70–99)
Sodium: 142 mEq/L (ref 135–145)

## 2012-09-13 LAB — CBC
HCT: 28.2 % — ABNORMAL LOW (ref 39.0–52.0)
Hemoglobin: 8.9 g/dL — ABNORMAL LOW (ref 13.0–17.0)
MCH: 22.4 pg — ABNORMAL LOW (ref 26.0–34.0)
MCHC: 31.6 g/dL (ref 30.0–36.0)
RBC: 3.97 MIL/uL — ABNORMAL LOW (ref 4.22–5.81)

## 2012-09-13 LAB — HEMATOCRIT: HCT: 27 % — ABNORMAL LOW (ref 39.0–52.0)

## 2012-09-13 LAB — HEMOGLOBIN
Hemoglobin: 8.7 g/dL — ABNORMAL LOW (ref 13.0–17.0)
Hemoglobin: 8.8 g/dL — ABNORMAL LOW (ref 13.0–17.0)
Hemoglobin: 9.1 g/dL — ABNORMAL LOW (ref 13.0–17.0)

## 2012-09-13 LAB — PROTIME-INR: INR: 1.75 — ABNORMAL HIGH (ref 0.00–1.49)

## 2012-09-13 MED ORDER — LISINOPRIL 20 MG PO TABS
20.0000 mg | ORAL_TABLET | Freq: Once | ORAL | Status: AC
  Administered 2012-09-13: 20 mg via ORAL
  Filled 2012-09-13: qty 1

## 2012-09-13 MED ORDER — LISINOPRIL 20 MG PO TABS
20.0000 mg | ORAL_TABLET | Freq: Two times a day (BID) | ORAL | Status: DC
  Administered 2012-09-13 – 2012-09-14 (×3): 20 mg via ORAL
  Filled 2012-09-13 (×6): qty 1

## 2012-09-13 MED ORDER — METOPROLOL TARTRATE 12.5 MG HALF TABLET
12.5000 mg | ORAL_TABLET | Freq: Once | ORAL | Status: AC
  Administered 2012-09-13: 12.5 mg via ORAL
  Filled 2012-09-13: qty 1

## 2012-09-13 MED ORDER — WARFARIN - PHARMACIST DOSING INPATIENT: Freq: Every day | Status: DC

## 2012-09-13 MED ORDER — AMLODIPINE BESYLATE 5 MG PO TABS
5.0000 mg | ORAL_TABLET | Freq: Once | ORAL | Status: AC
  Administered 2012-09-13: 5 mg via ORAL
  Filled 2012-09-13: qty 1

## 2012-09-13 MED ORDER — ATORVASTATIN CALCIUM 20 MG PO TABS
20.0000 mg | ORAL_TABLET | Freq: Every day | ORAL | Status: DC
  Administered 2012-09-13: 20 mg via ORAL
  Filled 2012-09-13 (×2): qty 1

## 2012-09-13 MED ORDER — WARFARIN SODIUM 3 MG PO TABS
9.0000 mg | ORAL_TABLET | Freq: Once | ORAL | Status: AC
  Administered 2012-09-13: 9 mg via ORAL
  Filled 2012-09-13: qty 1

## 2012-09-13 MED ORDER — QUINAPRIL HCL 10 MG PO TABS: 20.0000 mg | ORAL_TABLET | Freq: Two times a day (BID) | ORAL | Status: DC

## 2012-09-13 NOTE — Progress Notes (Signed)
ANTICOAGULATION CONSULT NOTE - Initial Consult  Pharmacy Consult for Coumadin Indication: mechanical valve - AVR  No Known Allergies  Patient Measurements: Height: 5\' 11"  (180.3 cm) Weight: 199 lb (90.266 kg) IBW/kg (Calculated) : 75.3    Vital Signs: Temp: 98.1 F (36.7 C) (12/12 0500) Temp src: Oral (12/12 0500) BP: 182/73 mmHg (12/12 0500) Pulse Rate: 69  (12/12 0500)  Labs:  Basename 09/13/12 0540 09/13/12 0004 09/12/12 1156 09/12/12 0535 09/11/12 1300 09/11/12 0718  HGB 8.9* 8.8* -- -- -- --  HCT 28.2* 27.3* 27.8* -- -- --  PLT 309 -- -- 309 329 --  APTT -- -- -- -- 37 --  LABPROT 19.8* -- -- 24.7* -- 28.6*  INR 1.75* -- -- 2.35* -- 2.87*  HEPARINUNFRC -- -- -- -- -- --  CREATININE 1.10 -- -- 1.06 1.07 --  CKTOTAL -- -- -- -- -- --  CKMB -- -- -- -- -- --  TROPONINI -- -- -- -- -- <0.30    Estimated Creatinine Clearance: 59.9 ml/min (by C-G formula based on Cr of 1.1).   Medical History: Past Medical History  Diagnosis Date  . Heart valve disease   . Coronary artery disease   . Hypertension   . GERD (gastroesophageal reflux disease)   . Diabetes mellitus without complication   . Aortic stenosis, severe   . S/P AVR (aortic valve replacement)     on chronic coagulation  . History of colonic polyps   . GI bleed     hx of    Medications:  Scheduled:    . amLODipine  5 mg Oral Daily  . [COMPLETED] amLODipine  5 mg Oral Once  . docusate sodium  100 mg Oral BID  . finasteride  5 mg Oral Daily  . lisinopril  20 mg Oral BID  . [COMPLETED] lisinopril  20 mg Oral Once  . metoprolol tartrate  12.5 mg Oral BID  . [COMPLETED] metoprolol tartrate  12.5 mg Oral Once  . pantoprazole  80 mg Oral Daily  . simvastatin  40 mg Oral QPM  . sucralfate  1 g Oral QID  . [DISCONTINUED] quinapril  10 mg Oral BID  . [DISCONTINUED] quinapril  20 mg Oral BID   Infusions:    . sodium chloride 50 mL/hr at 09/11/12 1320    Assessment: 78 yo M on coumadin PTA for AVR.  Admitted with abdominal pain, dizziness, weakness x few days, with known h/o GIB, noted to have heme-positive stools and hgb 7.1  Now s/p blood tx, hgb improved and stable, s/p EGD 12/10 which revealed no acute bleeding, problems.  GI recommended follow hgb, blood as necessary and possibly repeat colonoscopy in the future.  Home Coumadin Regimen: 6.5 mg daily (5mg  tablet + half of 3 mg tablet)  Last dose 12/9. INR on admit 12/10 reported as 2.87.  Goal of Therapy:  INR 2-3 Monitor platelets by anticoagulation protocol: Yes   Plan:  - Coumadin 9 mg po x 1 - Daily INR  Nylee Barbuto L. Illene Bolus, PharmD, BCPS Clinical Pharmacist Pager: [phone] Pharmacy: [phone] 09/13/2012 9:02 AM

## 2012-09-13 NOTE — Progress Notes (Signed)
Pt BP 182/73 this morning. MD paged and notified. MD ordered for Quinapril dose to be increased to 20mg  PO BID. Pharmacy substituted Lisinopril for Quinapril. MD also ordered Metoprolol 12.5mg  PO NOW, Norvasc 5mg  PO NOW, and Lisinopril 20mg  PO NOW. Pt asymptomatic at this time, will continue to monitor.  Harless Litten, RN 09/13/12

## 2012-09-13 NOTE — Progress Notes (Signed)
Subjective:  Patient denies any abdominal pain. Denies any chest pain or shortness of breath. Denies any palpitation. Had a few beats of nonsustained wide-complex tachycardia earlier this morning asymptomatic. No BM yet. Hemoglobin remained stable. BP remains elevated BP meds adjusted  Objective:  Vital Signs in the last 24 hours: Temp:  [98.1 F (36.7 C)-98.8 F (37.1 C)] 98.1 F (36.7 C) (12/12 0500) Pulse Rate:  [66-72] 69  (12/12 0500) Resp:  [18] 18  (12/11 2100) BP: (160-199)/(62-82) 182/73 mmHg (12/12 0500) SpO2:  [99 %-100 %] 100 % (12/12 0500) Weight:  [90.266 kg (199 lb)] 90.266 kg (199 lb) (12/12 0500)  Intake/Output from previous day: 12/11 0701 - 12/12 0700 In: 720 [P.O.:720] Out: 2000 [Urine:2000] Intake/Output from this shift: Total I/O In: 240 [P.O.:240] Out: -   Physical Exam: Neck: no adenopathy, no carotid bruit, no JVD and supple, symmetrical, trachea midline Lungs: clear to auscultation bilaterally Heart: regular rate and rhythm, S1, S2 normal and Soft systolic murmur and metallic heart sounds noted Abdomen: soft, non-tender; bowel sounds normal; no masses,  no organomegaly Extremities: extremities normal, atraumatic, no cyanosis or edema  Lab Results:  Basename 09/13/12 0540 09/13/12 0004 09/12/12 0535  WBC 9.8 -- 10.1  HGB 8.9* 8.8* --  PLT 309 -- 309    Basename 09/13/12 0540 09/12/12 0535  NA 142 140  K 3.9 3.6  CL 107 107  CO2 26 24  GLUCOSE 101* 98  BUN 10 12  CREATININE 1.10 1.06    Basename 09/11/12 0718  TROPONINI <0.30   Hepatic Function Panel  Basename 09/11/12 1300  PROT 7.3  ALBUMIN 3.5  AST 13  ALT 10  ALKPHOS 68  BILITOT 0.5  BILIDIR --  IBILI --   No results found for this basename: CHOL in the last 72 hours No results found for this basename: PROTIME in the last 72 hours  Imaging: Imaging results have been reviewed and Dg Abd Portable 1v  09/11/2012  *RADIOLOGY REPORT*  Clinical Data: Abdominal pain.  PORTABLE  ABDOMEN - 1 VIEW  Comparison: 11/23/2010.  Findings: There is air throughout the large and small bowel consistent with diffuse ileus.  No findings for obstruction or free air.  IMPRESSION: Diffuse ileus bowel gas pattern.   Original Report Authenticated By: Rudie Meyer, M.D.     Cardiac Studies:  Assessment/Plan:  Acute on chronic anemia rule out GI loss stable Status post aVR for severe aortic stenosis  Mild coronary artery disease  Hypertension  Non-insulin-dependent diabetes matters controlled by diet  Hypercholesteremia  History of colonic polyp  GERD  History of mild gastritis in the past  Status post nonsustained wide complex tachycardia Plan Advanced diet as per GI Restart Coumadin Check labs in a.m. If stable will DC home tomorrow  LOS: 2 days    [name] N 09/13/2012, 8:27 AM

## 2012-09-13 NOTE — Plan of Care (Signed)
Problem: Phase I Progression Outcomes Goal: Hemodynamically stable Outcome: Progressing B/P elevated this shift requiring extra doses B/P meds cont. To remain elevated will monitor Goal: Other Phase I Outcomes/Goals Outcome: Not Progressing Stool hem + MD aware and instructed to give coumadin and cont to monitor H&H

## 2012-09-14 LAB — CBC
Hemoglobin: 8.7 g/dL — ABNORMAL LOW (ref 13.0–17.0)
MCHC: 32 g/dL (ref 30.0–36.0)
RDW: 19.9 % — ABNORMAL HIGH (ref 11.5–15.5)
WBC: 10.4 10*3/uL (ref 4.0–10.5)

## 2012-09-14 LAB — BASIC METABOLIC PANEL
Chloride: 105 mEq/L (ref 96–112)
GFR calc Af Amer: 73 mL/min — ABNORMAL LOW (ref >90)
GFR calc non Af Amer: 63 mL/min — ABNORMAL LOW (ref >90)
Potassium: 3.7 mEq/L (ref 3.5–5.1)
Sodium: 139 mEq/L (ref 135–145)

## 2012-09-14 LAB — HEMOGLOBIN: Hemoglobin: 9 g/dL — ABNORMAL LOW (ref 13.0–17.0)

## 2012-09-14 LAB — PROTIME-INR: INR: 1.55 — ABNORMAL HIGH (ref 0.00–1.49)

## 2012-09-14 MED ORDER — FERROUS SULFATE 325 (65 FE) MG PO TABS: 325.0000 mg | ORAL_TABLET | Freq: Three times a day (TID) | ORAL | Status: DC

## 2012-09-14 MED ORDER — WARFARIN SODIUM 6 MG PO TABS
9.0000 mg | ORAL_TABLET | Freq: Once | ORAL | Status: DC
  Filled 2012-09-14: qty 1

## 2012-09-14 MED ORDER — DSS 100 MG PO CAPS: 100.0000 mg | ORAL_CAPSULE | Freq: Two times a day (BID) | ORAL | Status: DC

## 2012-09-14 NOTE — Discharge Summary (Signed)
  Discharge summary dictated on 09/14/2012 dictation [phone]

## 2012-09-14 NOTE — Progress Notes (Signed)
ANTICOAGULATION CONSULT NOTE - Initial Consult  Pharmacy Consult for Coumadin Indication: mechanical valve - AVR  No Known Allergies  Patient Measurements: Height: 5\' 11"  (180.3 cm) Weight: 199 lb (90.266 kg) IBW/kg (Calculated) : 75.3   Vital Signs: Temp: 98.1 F (36.7 C) (12/13 0500) Temp src: Oral (12/13 0500) BP: 173/67 mmHg (12/13 0500) Pulse Rate: 62  (12/13 0500)  Labs:  Basename 09/14/12 0650 09/13/12 2335 09/13/12 1247 09/13/12 0540 09/12/12 0535 09/11/12 1300  HGB 8.7* 8.7* -- -- -- --  HCT 27.2* 27.0* 28.2* -- -- --  PLT 315 -- -- 309 309 --  APTT -- -- -- -- -- 37  LABPROT 18.1* -- -- 19.8* 24.7* --  INR 1.55* -- -- 1.75* 2.35* --  HEPARINUNFRC -- -- -- -- -- --  CREATININE 1.10 -- -- 1.10 1.06 --  CKTOTAL -- -- -- -- -- --  CKMB -- -- -- -- -- --  TROPONINI -- -- -- -- -- --    Estimated Creatinine Clearance: 59.9 ml/min (by C-G formula based on Cr of 1.1).   Medical History: Past Medical History  Diagnosis Date  . Heart valve disease   . Coronary artery disease   . Hypertension   . GERD (gastroesophageal reflux disease)   . Diabetes mellitus without complication   . Aortic stenosis, severe   . S/P AVR (aortic valve replacement)     on chronic coagulation  . History of colonic polyps   . GI bleed     hx of    Medications:  Scheduled:     . amLODipine  5 mg Oral Daily  . atorvastatin  20 mg Oral q1800  . docusate sodium  100 mg Oral BID  . finasteride  5 mg Oral Daily  . lisinopril  20 mg Oral BID  . metoprolol tartrate  12.5 mg Oral BID  . pantoprazole  80 mg Oral Daily  . sucralfate  1 g Oral QID  . Warfarin - Pharmacist Dosing Inpatient   Does not apply q1800   Infusions:     . sodium chloride 10 mL (09/13/12 0816)    Assessment: 78 yo M on coumadin PTA for AVR. Admitted with abdominal pain, dizziness, weakness x few days, with known h/o GIB, noted to have heme-positive stools and hgb 7.1  Now s/p blood tx, hgb improved and  stable, s/p EGD 12/10 which revealed no acute bleeding, problems.  GI recommended follow hgb, blood as necessary and possibly repeat colonoscopy in the future.  Note hemoccult card remains positive.  Coumadin was held 12/10-12/11 and restarted on 12/12.  INR remains subtherapeutic as would be expected after holding x 2 doses.  Home Coumadin Regimen: 6.5 mg daily (5mg  tablet + half of 3 mg tablet)  INR on admit 12/10 reported as 2.87.  Goal of Therapy:  INR 2-3 Monitor platelets by anticoagulation protocol: Yes   Plan:  - Coumadin 9 mg po x 1 - Daily INR  Estella Husk, Pharm.D., BCPS Clinical Pharmacist  Phone [phone] Pager [phone] 09/14/2012, 9:37 AM

## 2012-09-15 NOTE — Discharge Summary (Signed)
[name], [name] NO.:  [id number]  MEDICAL RECORD NO.:  [id number]  LOCATION:  [id number]                        FACILITY:  [facility]  PHYSICIAN:  Eduardo Osier. Sharyn Lull, M.D. DATE OF BIRTH:  Mar 10, 1933  DATE OF ADMISSION:  09/11/2012 DATE OF DISCHARGE:  09/14/2012                              DISCHARGE SUMMARY   ADMITTING DIAGNOSES: 1. Acute on chronic anemia rule out gastrointestinal loss. 2. Status post aortic valve replacement for severe aortic stenosis     with metallic valve in the past. 3. Mild coronary artery disease. 4. Hypertension. 5. Non-insulin-dependent diabetes mellitus, controlled by diet. 6. Hypercholesteremia. 7. History of colonic polyps in the past. 8. Gastroesophageal reflux disease. 9. History of mild gastritis in the past.  FINAL DIAGNOSES: 1. Acute on chronic anemia, etiology unclear, status post upper     gastrointestinal or upper endoscopy, which was negative. 2. Status post aortic valve replacement for severe aortic stenosis     with metallic aortic valve. 3. Mild coronary artery disease. 4. Hypertension. 5. Non-insulin-dependent diabetes mellitus, controlled by diet. 6. Hypercholesteremia. 7. History of colonic polyps. 8. Gastroesophageal reflux disease.  DISCHARGE HOME MEDICATIONS: 1. Feosol 325 mg 1 tablet 3 times daily. 2. Colace 100 mg twice daily as needed. 3. Clonidine 0.2 mg daily. 4. Nexium 40 mg 1 capsule daily. 5. Proscar 5 mg 1 daily. 6. Metoprolol tartrate 12.5 mg twice daily. 7. Accupril 20 mg twice daily. 8. Simvastatin 40 mg daily. 9. Carafate 1 g 4 times daily. 10.Warfarin 6.5 mg daily.  DIET:  Low salt, low cholesterol.  ACTIVITY:  Increase activity as tolerated.  The patient has been advised to check PT/INR and CBC early next week. Follow with me in 1 week.  Follow up with GI in 2 weeks.  The patient has been advised if he feels dizzy, weak, lightheaded, or if he notices any black tarry stools or any blood  in the stool or coughing of blood, should call 911 immediately.  CONDITION AT DISCHARGE:  Stable.  Discussed with the patient at length regarding continuing anticoagulation with Coumadin versus keeping him on aspirin as he had prior GI bleed in the past.  Extensive workup including upper endoscopy, colonoscopy, and capsule studies were negative.  The patient agrees to stay on Coumadin for a while and if he bleeds or if he drops his hemoglobin significantly, we will consider stopping the Coumadin in future.  BRIEF HISTORY AND HOSPITAL COURSE:  [name] is a 78 year old black male with past medical history significant for mild coronary artery disease, severe aortic stenosis status post AVR on chronic anticoagulation, hypertension, hypercholesteremia, GERD, history of colonic polyps, non-insulin-dependent diabetes mellitus controlled by diet, history of mild gastritis, history of GI bleeding in the past.  He came to the ER complaining of vague abdominal pain associated with dizziness, weakness for last few days and was noted to have heme- positive stools and hemoglobin of 7.1.  The patient was also noted to have orthostatic hypotension.  The patient denies any chest pain, nausea, vomiting.  Denies any black tarry stools.  Denies any hematuria. Denies any bright red blood per rectum.  Denies any hemoptysis or hematemesis.  PAST  MEDICAL HISTORY:  As above.  PAST SURGICAL HISTORY:  Had AVR in the past.  PHYSICAL EXAMINATION:  VITAL SIGNS:  His blood pressure was 125/53, pulse was 101 and was tachycardic. HEENT:  Conjunctivae was pale. NECK:  Supple.  No JVD. LUNGS:  Clear to auscultation without rhonchi or rales. CARDIOVASCULAR:  S1, S2 was normal.  He was tachycardic.  There was soft systolic murmur.  Metallic heart sounds were noted. ABDOMEN:  Soft.  There was mild epigastric tenderness.  There was no guarding or rebound. EXTREMITIES:  There was no clubbing, cyanosis, or  edema.  LABORATORY DATA:  Admission hemoglobin was 7.1, hematocrit 22.8, white count of 7.5.  Repeat hemoglobin was 6.8, hematocrit 21.6.  The patient received 2 units of packed RBCs.  After transfusion, hemoglobin was 8.4 and hematocrit 25.7.  Repeat hemoglobin next day was 9.4, hematocrit 29.2.  Yesterday hemoglobin was 8.7, hematocrit 27.2.  Today hemoglobin is 9, hematocrit 28, which has been stable for last 2 days.  His sodium was 140, potassium 3.9, BUN 16, creatinine 1.18.  Today, BUN is 9 and creatinine 1.10.  His blood sugar was 116.  Fasting blood sugar today is 98.  His PT was 28.6, INR 2.87.  The patient has been restarted on Coumadin.  His PT today is 18.1, INR 1.55.  TSH is 0.468, which is in normal range.  The patient underwent upper endoscopy, which showed no evidence of acute bleed or ulcers.  BRIEF HOSPITAL COURSE:  The patient was admitted to telemetry unit.  GI consultation was obtained with Dr. Elnoria Howard and Dr. Loreta Ave.  The patient was transfused 2 units of packed RBCs.  His hemoglobin has remained stable. The patient subsequently underwent upper endoscopy, which was unrevealing for any acute bleeding.  The patient also had colonoscopy in the past, which showed no evidence of acute source of bleeding and was felt not the candidate for repeat colonoscopy unless he has significant bleeds.  The patient's hemoglobin has been stable.  The patient will be discharged home on above medications and will be followed up in my office in 1 week.  We will recheck his hemoglobin and PT/INR.  We will keep the INR close to 2 and will be followed up by GI as outpatient in 2 weeks.     Eduardo Osier. Sharyn Lull, M.D.     [facility]/MEDQ  D:  09/14/2012  T:  09/15/2012  Job:  [id number]

## 2012-10-15 NOTE — Clinical Documentation Improvement (Signed)
Acute blood loss anemia

## 2012-11-11 ENCOUNTER — Emergency Department (HOSPITAL_COMMUNITY): Payer: Medicare Other

## 2012-11-11 ENCOUNTER — Encounter (HOSPITAL_COMMUNITY): Payer: Self-pay | Admitting: *Deleted

## 2012-11-11 ENCOUNTER — Emergency Department (HOSPITAL_COMMUNITY)
Admission: EM | Admit: 2012-11-11 | Discharge: 2012-11-11 | Disposition: A | Discharge status: Home / Self Care | Payer: Medicare Other | Attending: Emergency Medicine | Admitting: Emergency Medicine

## 2012-11-11 DIAGNOSIS — R42 Dizziness and giddiness: Secondary | ICD-10-CM

## 2012-11-11 DIAGNOSIS — Z860100 Personal history of colon polyps, unspecified: Secondary | ICD-10-CM | POA: Insufficient documentation

## 2012-11-11 DIAGNOSIS — R079 Chest pain, unspecified: Secondary | ICD-10-CM | POA: Insufficient documentation

## 2012-11-11 DIAGNOSIS — K219 Gastro-esophageal reflux disease without esophagitis: Secondary | ICD-10-CM | POA: Insufficient documentation

## 2012-11-11 DIAGNOSIS — Z87891 Personal history of nicotine dependence: Secondary | ICD-10-CM | POA: Insufficient documentation

## 2012-11-11 DIAGNOSIS — E119 Type 2 diabetes mellitus without complications: Secondary | ICD-10-CM | POA: Insufficient documentation

## 2012-11-11 DIAGNOSIS — Z7901 Long term (current) use of anticoagulants: Secondary | ICD-10-CM | POA: Insufficient documentation

## 2012-11-11 DIAGNOSIS — R011 Cardiac murmur, unspecified: Secondary | ICD-10-CM | POA: Insufficient documentation

## 2012-11-11 DIAGNOSIS — Z79899 Other long term (current) drug therapy: Secondary | ICD-10-CM | POA: Insufficient documentation

## 2012-11-11 DIAGNOSIS — R5381 Other malaise: Secondary | ICD-10-CM | POA: Insufficient documentation

## 2012-11-11 DIAGNOSIS — Z8679 Personal history of other diseases of the circulatory system: Secondary | ICD-10-CM | POA: Insufficient documentation

## 2012-11-11 DIAGNOSIS — R109 Unspecified abdominal pain: Secondary | ICD-10-CM | POA: Insufficient documentation

## 2012-11-11 DIAGNOSIS — D649 Anemia, unspecified: Secondary | ICD-10-CM | POA: Insufficient documentation

## 2012-11-11 DIAGNOSIS — I10 Essential (primary) hypertension: Secondary | ICD-10-CM | POA: Insufficient documentation

## 2012-11-11 DIAGNOSIS — I160 Hypertensive urgency: Secondary | ICD-10-CM

## 2012-11-11 DIAGNOSIS — I951 Orthostatic hypotension: Secondary | ICD-10-CM

## 2012-11-11 DIAGNOSIS — I2510 Atherosclerotic heart disease of native coronary artery without angina pectoris: Secondary | ICD-10-CM | POA: Insufficient documentation

## 2012-11-11 DIAGNOSIS — Z8719 Personal history of other diseases of the digestive system: Secondary | ICD-10-CM | POA: Insufficient documentation

## 2012-11-11 LAB — BASIC METABOLIC PANEL
BUN: 11 mg/dL (ref 6–23)
Calcium: 9.6 mg/dL (ref 8.4–10.5)
GFR calc Af Amer: 68 mL/min — ABNORMAL LOW (ref >90)
GFR calc non Af Amer: 59 mL/min — ABNORMAL LOW (ref >90)
Potassium: 3.7 mEq/L (ref 3.5–5.1)

## 2012-11-11 LAB — CBC WITH DIFFERENTIAL/PLATELET
Basophils Absolute: 0 10*3/uL (ref 0.0–0.1)
Basophils Relative: 0 % (ref 0–1)
Hemoglobin: 12.3 g/dL — ABNORMAL LOW (ref 13.0–17.0)
MCHC: 33.3 g/dL (ref 30.0–36.0)
Monocytes Relative: 10 % (ref 3–12)
Neutro Abs: 5.7 10*3/uL (ref 1.7–7.7)
Neutrophils Relative %: 71 % (ref 43–77)
Platelets: 197 10*3/uL (ref 150–400)
RDW: 20.2 % — ABNORMAL HIGH (ref 11.5–15.5)

## 2012-11-11 LAB — URINALYSIS, ROUTINE W REFLEX MICROSCOPIC
Ketones, ur: 15 mg/dL — AB
Leukocytes, UA: NEGATIVE
Nitrite: NEGATIVE
Protein, ur: NEGATIVE mg/dL
Urobilinogen, UA: 1 mg/dL (ref 0.0–1.0)

## 2012-11-11 LAB — TROPONIN I: Troponin I: <0.3 ng/mL (ref <0.30)

## 2012-11-11 LAB — PROTIME-INR: Prothrombin Time: 20.1 seconds — ABNORMAL HIGH (ref 11.6–15.2)

## 2012-11-11 MED ORDER — HYDRALAZINE HCL 25 MG PO TABS
25.0000 mg | ORAL_TABLET | Freq: Once | ORAL | Status: AC
  Administered 2012-11-11: 25 mg via ORAL
  Filled 2012-11-11: qty 1

## 2012-11-11 MED ORDER — SODIUM CHLORIDE 0.9 % IV SOLN
Freq: Once | INTRAVENOUS | Status: AC
  Administered 2012-11-11: 08:00:00 via INTRAVENOUS

## 2012-11-11 MED ORDER — CLONIDINE HCL 0.2 MG PO TABS
0.2000 mg | ORAL_TABLET | Freq: Once | ORAL | Status: AC
  Administered 2012-11-11: 0.2 mg via ORAL
  Filled 2012-11-11 (×2): qty 1

## 2012-11-11 MED ORDER — SODIUM CHLORIDE 0.9 % IV BOLUS (SEPSIS)
500.0000 mL | Freq: Once | INTRAVENOUS | Status: AC
  Administered 2012-11-11: 500 mL via INTRAVENOUS

## 2012-11-11 MED ORDER — METOPROLOL TARTRATE 25 MG PO TABS
25.0000 mg | ORAL_TABLET | Freq: Once | ORAL | Status: AC
  Administered 2012-11-11: 25 mg via ORAL
  Filled 2012-11-11 (×2): qty 1

## 2012-11-11 NOTE — ED Provider Notes (Addendum)
History     CSN: [id number]  Arrival date & time 11/11/12  0650   First MD Initiated Contact with Patient 11/11/12 [phone]      Chief Complaint  Patient presents with  . Dizziness    (Consider location/radiation/quality/duration/timing/severity/associated sxs/prior treatment) HPI Comments: 79 year old black male comes in with cc of dizziness and abd discomfort. He has past medical history significant for mild coronary artery disease, severe aortic stenosis status post AVR on chronic anticoagulation, and a recent admission for similar sx, that led to finding of anemia, with transfusion and negative upper and lower EGD. Pt states that he started getting dizzy on Friday, and since he wasn't getting better, he came to the ED. Pt's dizziness is described as lightheadedness, and it is usually provoked with him getting up, or walking, but occasionally it hits him even when he is laying down. No n/v/f/c. No chest pain, sob. No diarrhea, or blood loss that patient is aware off. No weight gain.   The history is provided by the patient and medical records.    Past Medical History  Diagnosis Date  . Heart valve disease   . Coronary artery disease   . Hypertension   . GERD (gastroesophageal reflux disease)   . Diabetes mellitus without complication   . Aortic stenosis, severe   . S/P AVR (aortic valve replacement)     on chronic coagulation  . History of colonic polyps   . GI bleed     hx of    Past Surgical History  Procedure Laterality Date  . Esophagogastroduodenoscopy  09/11/2012    Procedure: ESOPHAGOGASTRODUODENOSCOPY (EGD);  Surgeon: Theda Belfast, MD;  Location: [facility] ENDOSCOPY;  Service: Endoscopy;  Laterality: N/A;    No family history on file.  History  Substance Use Topics  . Smoking status: Former Games developer  . Smokeless tobacco: Not on file  . Alcohol Use: No      Review of Systems  Constitutional: Positive for fatigue. Negative for fever, chills and activity change.   HENT: Negative for neck pain.   Eyes: Negative for visual disturbance.  Respiratory: Negative for cough, chest tightness and shortness of breath.   Cardiovascular: Negative for chest pain.  Gastrointestinal: Negative for nausea, vomiting and abdominal distention.  Genitourinary: Negative for dysuria, enuresis and difficulty urinating.  Musculoskeletal: Negative for arthralgias.  Skin: Negative for rash.  Neurological: Positive for dizziness, weakness and light-headedness. Negative for syncope and headaches.  Psychiatric/Behavioral: Negative for confusion.    Allergies  Review of patient's allergies indicates no known allergies.  Home Medications   Current Outpatient Rx  Name  Route  Sig  Dispense  Refill  . bimatoprost (LUMIGAN) 0.01 % SOLN   Both Eyes   Place 1 drop into both eyes at bedtime.         . cloNIDine (CATAPRES) 0.2 MG tablet   Oral   Take 0.2 mg by mouth daily.         Marland Kitchen docusate sodium 100 MG CAPS   Oral   Take 100 mg by mouth 2 (two) times daily.   30 capsule   3   . esomeprazole (NEXIUM) 40 MG capsule   Oral   Take 40 mg by mouth daily before breakfast.         . ferrous sulfate (FERROUSUL) 325 (65 FE) MG tablet   Oral   Take 1 tablet (325 mg total) by mouth 3 (three) times daily with meals.   90 tablet   3   .  finasteride (PROSCAR) 5 MG tablet   Oral   Take 5 mg by mouth daily.         . metoprolol tartrate (LOPRESSOR) 25 MG tablet   Oral   Take 12.5 mg by mouth 2 (two) times daily.         . quinapril (ACCUPRIL) 20 MG tablet   Oral   Take 20 mg by mouth 2 (two) times daily.         . simvastatin (ZOCOR) 80 MG tablet   Oral   Take 80 mg by mouth at bedtime.         . sucralfate (CARAFATE) 1 GM/10ML suspension   Oral   Take 1 g by mouth 4 (four) times daily.         Marland Kitchen warfarin (COUMADIN) 3 MG tablet   Oral   Take 1.5 mg by mouth daily. Take with coumadin 5mg  tablets         . warfarin (COUMADIN) 5 MG tablet    Oral   Take 5 mg by mouth daily. Take with 1/2 tablet of the coumadin 3 mg tablet           BP 171/69  Pulse 88  Temp(Src) 98.4 F (36.9 C) (Oral)  Resp 18  SpO2 100%  Physical Exam  Nursing note and vitals reviewed. Constitutional: He is oriented to person, place, and time. He appears well-developed.  HENT:  Head: Normocephalic and atraumatic.  Eyes: Conjunctivae and EOM are normal. Pupils are equal, round, and reactive to light.  Neck: Normal range of motion. Neck supple. No JVD present.  Cardiovascular: Normal rate and regular rhythm.   Murmur heard. Pulmonary/Chest: Effort normal and breath sounds normal.  Abdominal: Soft. Bowel sounds are normal. He exhibits no distension. There is no tenderness. There is no rebound and no guarding.  Neurological: He is alert and oriented to person, place, and time.  Skin: Skin is warm.    ED Course  Procedures (including critical care time)  Labs Reviewed  BASIC METABOLIC PANEL - Abnormal; Notable for the following:    GFR calc non Af Amer 59 (*)    GFR calc Af Amer 68 (*)    All other components within normal limits  CBC WITH DIFFERENTIAL - Abnormal; Notable for the following:    Hemoglobin 12.3 (*)    HCT 36.9 (*)    RDW 20.2 (*)    All other components within normal limits  TROPONIN I  URINALYSIS, ROUTINE W REFLEX MICROSCOPIC   Dg Chest Port 1 View  11/11/2012  *RADIOLOGY REPORT*  Clinical Data: Dizziness  PORTABLE CHEST - 1 VIEW  Comparison: Chest radiograph 09/11/2012  Findings: Sternotomy wires overlie normal cardiac silhouette.  No effusion, trace, pneumothorax.  No osseous abnormality  IMPRESSION: No acute cardiopulmonary process.   Original Report Authenticated By: Genevive Bi, M.D.      No diagnosis found.    MDM   Date: 11/11/2012  Rate: 101  Rhythm: sinus tachycardia  QRS Axis: normal  Intervals: normal  ST/T Wave abnormalities: nonspecific ST/T changes  Conduction Disutrbances:none  Narrative  Interpretation:   Old EKG Reviewed: changes noted - diffuse ST depressions, < 1 mm  Differential diagnosis includes: ACS syndrome CHF exacerbation Valvular disorder Myocarditis Pericarditis Pericardial effusion Pneumonia Pleural effusion Pulmonary edema PE Anemia AAA  Pt comes in with cc of some abd discomfort and dizziness. He had a recent admission for similar complains - was found to have acute anemia - unknown source, transfused  with 2 units and discharged. He has no bleeding source at this time that he can endorse. He has hx of AS, s/p valve replacement - there is no chest pain, sob, no near syncope either - but the ddx, along with anemia, orthostatics, will have valvular pathology for him - but lower on the list. Other consideration is AAA - so we will get US aorta.  Pt has some non specific ST depressions - will repeat EKG.  Derwood Kaplan, MD 11/11/12 0843   Date: 11/11/2012  Rate: 101  Rhythm: sinus tachycardia  QRS Axis: normal  Intervals: normal  ST/T Wave abnormalities: nonspecific ST/T changes  Conduction Disutrbances:none  Narrative Interpretation:   Old EKG Reviewed: unchanged - still has some ST depression in the inferior and lateral leads.      Derwood Kaplan, MD 11/11/12 1020  3:24 PM Pt has been in the ED for an extended period of time, and we have monitored him closely. His BP is better. We spoke with Dr. Sharyn Lull after hydrating the patient, and he is happy to see the patient tomorrow morning. He thinks BP meds might need a little adjustment. Will discharge. Pt has ambulated now on 2 occasions, and is deemed stable with gait.   Derwood Kaplan, MD 11/11/12 1525

## 2012-11-11 NOTE — ED Notes (Signed)
Dizzy spells and discomfort in center of chest. Stomach feels like it is burning.

## 2012-11-11 NOTE — ED Notes (Signed)
Pt ambulated in the hall. Pt still complains of weakness and dizziness. Vitals updated

## 2012-11-17 ENCOUNTER — Emergency Department (HOSPITAL_COMMUNITY): Payer: Medicare Other

## 2012-11-17 ENCOUNTER — Encounter (HOSPITAL_COMMUNITY): Payer: Self-pay | Admitting: *Deleted

## 2012-11-17 ENCOUNTER — Emergency Department (HOSPITAL_COMMUNITY)
Admission: EM | Admit: 2012-11-17 | Discharge: 2012-11-17 | Disposition: A | Discharge status: Home / Self Care | Payer: Medicare Other | Attending: Emergency Medicine | Admitting: Emergency Medicine

## 2012-11-17 DIAGNOSIS — Z87891 Personal history of nicotine dependence: Secondary | ICD-10-CM | POA: Insufficient documentation

## 2012-11-17 DIAGNOSIS — Z952 Presence of prosthetic heart valve: Secondary | ICD-10-CM | POA: Insufficient documentation

## 2012-11-17 DIAGNOSIS — Z79899 Other long term (current) drug therapy: Secondary | ICD-10-CM | POA: Insufficient documentation

## 2012-11-17 DIAGNOSIS — I10 Essential (primary) hypertension: Secondary | ICD-10-CM | POA: Insufficient documentation

## 2012-11-17 DIAGNOSIS — Z8719 Personal history of other diseases of the digestive system: Secondary | ICD-10-CM | POA: Insufficient documentation

## 2012-11-17 DIAGNOSIS — I2510 Atherosclerotic heart disease of native coronary artery without angina pectoris: Secondary | ICD-10-CM | POA: Insufficient documentation

## 2012-11-17 DIAGNOSIS — R001 Bradycardia, unspecified: Secondary | ICD-10-CM

## 2012-11-17 DIAGNOSIS — I350 Nonrheumatic aortic (valve) stenosis: Secondary | ICD-10-CM | POA: Insufficient documentation

## 2012-11-17 DIAGNOSIS — N390 Urinary tract infection, site not specified: Secondary | ICD-10-CM | POA: Insufficient documentation

## 2012-11-17 DIAGNOSIS — Z860100 Personal history of colon polyps, unspecified: Secondary | ICD-10-CM | POA: Insufficient documentation

## 2012-11-17 DIAGNOSIS — I38 Endocarditis, valve unspecified: Secondary | ICD-10-CM | POA: Insufficient documentation

## 2012-11-17 DIAGNOSIS — K219 Gastro-esophageal reflux disease without esophagitis: Secondary | ICD-10-CM | POA: Insufficient documentation

## 2012-11-17 DIAGNOSIS — R51 Headache: Secondary | ICD-10-CM | POA: Insufficient documentation

## 2012-11-17 DIAGNOSIS — Z7901 Long term (current) use of anticoagulants: Secondary | ICD-10-CM | POA: Insufficient documentation

## 2012-11-17 DIAGNOSIS — R42 Dizziness and giddiness: Secondary | ICD-10-CM | POA: Insufficient documentation

## 2012-11-17 DIAGNOSIS — Z8679 Personal history of other diseases of the circulatory system: Secondary | ICD-10-CM | POA: Insufficient documentation

## 2012-11-17 DIAGNOSIS — I498 Other specified cardiac arrhythmias: Secondary | ICD-10-CM | POA: Insufficient documentation

## 2012-11-17 DIAGNOSIS — E119 Type 2 diabetes mellitus without complications: Secondary | ICD-10-CM | POA: Insufficient documentation

## 2012-11-17 LAB — CBC
HCT: 36.5 % — ABNORMAL LOW (ref 39.0–52.0)
Hemoglobin: 12.3 g/dL — ABNORMAL LOW (ref 13.0–17.0)
MCH: 26.6 pg (ref 26.0–34.0)
MCHC: 33.7 g/dL (ref 30.0–36.0)
RBC: 4.63 MIL/uL (ref 4.22–5.81)

## 2012-11-17 LAB — COMPREHENSIVE METABOLIC PANEL
ALT: 12 U/L (ref 0–53)
Alkaline Phosphatase: 70 U/L (ref 39–117)
BUN: 16 mg/dL (ref 6–23)
CO2: 25 mEq/L (ref 19–32)
GFR calc Af Amer: 56 mL/min — ABNORMAL LOW (ref >90)
GFR calc non Af Amer: 48 mL/min — ABNORMAL LOW (ref >90)
Glucose, Bld: 85 mg/dL (ref 70–99)
Potassium: 3.6 mEq/L (ref 3.5–5.1)
Sodium: 140 mEq/L (ref 135–145)
Total Protein: 7.5 g/dL (ref 6.0–8.3)

## 2012-11-17 LAB — URINALYSIS, ROUTINE W REFLEX MICROSCOPIC
Bilirubin Urine: NEGATIVE
Glucose, UA: NEGATIVE mg/dL
Ketones, ur: NEGATIVE mg/dL
Specific Gravity, Urine: 1.016 (ref 1.005–1.030)
pH: 5.5 (ref 5.0–8.0)

## 2012-11-17 LAB — URINE MICROSCOPIC-ADD ON

## 2012-11-17 LAB — PROTIME-INR: Prothrombin Time: 20.3 seconds — ABNORMAL HIGH (ref 11.6–15.2)

## 2012-11-17 MED ORDER — SODIUM CHLORIDE 0.9 % IV BOLUS (SEPSIS)
500.0000 mL | Freq: Once | INTRAVENOUS | Status: AC
  Administered 2012-11-17: 500 mL via INTRAVENOUS

## 2012-11-17 MED ORDER — CEPHALEXIN 500 MG PO CAPS: 500.0000 mg | ORAL_CAPSULE | Freq: Three times a day (TID) | ORAL | Status: DC

## 2012-11-17 MED ORDER — DEXTROSE 5 % IV SOLN
1.0000 g | INTRAVENOUS | Status: DC
  Administered 2012-11-17: 1 g via INTRAVENOUS
  Filled 2012-11-17: qty 10

## 2012-11-17 NOTE — ED Notes (Signed)
ECG was completed in triage

## 2012-11-17 NOTE — ED Provider Notes (Signed)
History     CSN: [id number]  Arrival date & time 11/17/12  0910   First MD Initiated Contact with Patient 11/17/12 1117      Chief Complaint  Patient presents with  . Dizziness    (Consider location/radiation/quality/duration/timing/severity/associated sxs/prior treatment) The history is provided by the patient.  pt c/o intermittent sense of dizziness for the past few days. States occasionally feels mildly room spinning, at other times more of a lightheaded sensation. Lasts seconds to minutes. No specific exacerbating or alleviating factors. Not related to exertion. No associated cp or discomfort. No sob or unusual doe or fatigue. Normal appetite. No nvd. No abd pain. No gu c/o. Denies congestion, cough or uri symptoms. No hearing loss, tinnitus or ear pain. Mild frontal headache on and off. No eye pain or change in vision. No neck pain or stiffness. No numbness/weakness. No melena, rectal bleeding or blood loss. States bp med recently increased. Notes recent prior ed visit for same, no specific diagnosis, and states has appt w pcp this Monday for same. Denies palpitations or sense of fast or irregular heart beat. No fainting.      Past Medical History  Diagnosis Date  . Heart valve disease   . Coronary artery disease   . Hypertension   . GERD (gastroesophageal reflux disease)   . Diabetes mellitus without complication   . Aortic stenosis, severe   . S/P AVR (aortic valve replacement)     on chronic coagulation  . History of colonic polyps   . GI bleed     hx of    Past Surgical History  Procedure Laterality Date  . Esophagogastroduodenoscopy  09/11/2012    Procedure: ESOPHAGOGASTRODUODENOSCOPY (EGD);  Surgeon: Theda Belfast, MD;  Location: [facility] ENDOSCOPY;  Service: Endoscopy;  Laterality: N/A;  . Cardiac surgery      valve surgery    No family history on file.  History  Substance Use Topics  . Smoking status: Former Games developer  . Smokeless tobacco: Not on file  . Alcohol  Use: No      Review of Systems  Constitutional: Negative for fever and chills.  HENT: Negative for congestion, neck pain and neck stiffness.   Eyes: Negative for visual disturbance.  Respiratory: Negative for cough and shortness of breath.   Cardiovascular: Negative for chest pain, palpitations and leg swelling.  Gastrointestinal: Negative for abdominal pain.  Endocrine: Negative for polyuria.  Genitourinary: Negative for flank pain.  Musculoskeletal: Negative for back pain.  Skin: Negative for rash.  Neurological: Negative for headaches.  Hematological: Does not bruise/bleed easily.  Psychiatric/Behavioral: Negative for confusion.    Allergies  Review of patient's allergies indicates no known allergies.  Home Medications   Current Outpatient Rx  Name  Route  Sig  Dispense  Refill  . bimatoprost (LUMIGAN) 0.01 % SOLN   Both Eyes   Place 1 drop into both eyes at bedtime.         . cloNIDine (CATAPRES) 0.2 MG tablet   Oral   Take 0.2 mg by mouth daily.         Marland Kitchen docusate sodium 100 MG CAPS   Oral   Take 100 mg by mouth 2 (two) times daily.   30 capsule   3   . esomeprazole (NEXIUM) 40 MG capsule   Oral   Take 40 mg by mouth daily before breakfast.         . ferrous sulfate (FERROUSUL) 325 (65 FE) MG tablet   Oral  Take 1 tablet (325 mg total) by mouth 3 (three) times daily with meals.   90 tablet   3   . finasteride (PROSCAR) 5 MG tablet   Oral   Take 5 mg by mouth daily.         . metoprolol tartrate (LOPRESSOR) 25 MG tablet   Oral   Take 12.5 mg by mouth 2 (two) times daily.         . quinapril (ACCUPRIL) 20 MG tablet   Oral   Take 20 mg by mouth 2 (two) times daily.         . simvastatin (ZOCOR) 80 MG tablet   Oral   Take 80 mg by mouth at bedtime.         . sucralfate (CARAFATE) 1 GM/10ML suspension   Oral   Take 1 g by mouth 4 (four) times daily.         Marland Kitchen warfarin (COUMADIN) 3 MG tablet   Oral   Take 1.5 mg by mouth daily.  Take with coumadin 5mg  tablets         . warfarin (COUMADIN) 5 MG tablet   Oral   Take 5 mg by mouth daily. Take with 1/2 tablet of the coumadin 3 mg tablet           BP 138/87  Pulse 63  Temp(Src) 98.4 F (36.9 C) (Oral)  Resp 16  SpO2 99%  Physical Exam  Nursing note and vitals reviewed. Constitutional: He is oriented to person, place, and time. He appears well-developed and well-nourished. No distress.  HENT:  Head: Atraumatic.  Right Ear: External ear normal.  Left Ear: External ear normal.  Nose: Nose normal.  Mouth/Throat: Oropharynx is clear and moist.  tms wnl  Eyes: Pupils are equal, round, and reactive to light. No scleral icterus.  Neck: Neck supple. No tracheal deviation present.  No bruit  Cardiovascular: Normal rate, regular rhythm and intact distal pulses.   No murmur heard. Pulmonary/Chest: Effort normal and breath sounds normal. No accessory muscle usage. No respiratory distress.  Abdominal: Soft. Bowel sounds are normal. He exhibits no distension and no mass. There is no tenderness. There is no rebound and no guarding.  Genitourinary:  No cva tenderness  Musculoskeletal: Normal range of motion. He exhibits no edema and no tenderness.  Neurological: He is alert and oriented to person, place, and time. No cranial nerve deficit.  Motor intact bil. No pronator drift. Ambulates w steady gait.   Skin: Skin is warm and dry. He is not diaphoretic.  Psychiatric: He has a normal mood and affect.    ED Course  Procedures (including critical care time)   Results for orders placed during the hospital encounter of 11/17/12  URINALYSIS, ROUTINE W REFLEX MICROSCOPIC      Result Value Range   Color, Urine YELLOW  YELLOW   APPearance CLOUDY (*) CLEAR   Specific Gravity, Urine 1.016  1.005 - 1.030   pH 5.5  5.0 - 8.0   Glucose, UA NEGATIVE  NEGATIVE mg/dL   Hgb urine dipstick NEGATIVE  NEGATIVE   Bilirubin Urine NEGATIVE  NEGATIVE   Ketones, ur NEGATIVE   NEGATIVE mg/dL   Protein, ur NEGATIVE  NEGATIVE mg/dL   Urobilinogen, UA 0.2  0.0 - 1.0 mg/dL   Nitrite NEGATIVE  NEGATIVE   Leukocytes, UA MODERATE (*) NEGATIVE  CBC      Result Value Range   WBC 6.4  4.0 - 10.5 K/uL   RBC 4.63  4.22 - 5.81 MIL/uL   Hemoglobin 12.3 (*) 13.0 - 17.0 g/dL   HCT 16.1 (*) 09.6 - 04.5 %   MCV 78.8  78.0 - 100.0 fL   MCH 26.6  26.0 - 34.0 pg   MCHC 33.7  30.0 - 36.0 g/dL   RDW 40.9 (*) 81.1 - 91.4 %   Platelets 251  150 - 400 K/uL  COMPREHENSIVE METABOLIC PANEL      Result Value Range   Sodium 140  135 - 145 mEq/L   Potassium 3.6  3.5 - 5.1 mEq/L   Chloride 105  96 - 112 mEq/L   CO2 25  19 - 32 mEq/L   Glucose, Bld 85  70 - 99 mg/dL   BUN 16  6 - 23 mg/dL   Creatinine, Ser 7.82 (*) 0.50 - 1.35 mg/dL   Calcium 9.6  8.4 - 95.6 mg/dL   Total Protein 7.5  6.0 - 8.3 g/dL   Albumin 3.6  3.5 - 5.2 g/dL   AST 16  0 - 37 U/L   ALT 12  0 - 53 U/L   Alkaline Phosphatase 70  39 - 117 U/L   Total Bilirubin 0.4  0.3 - 1.2 mg/dL   GFR calc non Af Amer 48 (*) >90 mL/min   GFR calc Af Amer 56 (*) >90 mL/min  PROTIME-INR      Result Value Range   Prothrombin Time 20.3 (*) 11.6 - 15.2 seconds   INR 1.81 (*) 0.00 - 1.49  TROPONIN I      Result Value Range   Troponin I <0.30  <0.30 ng/mL  URINE MICROSCOPIC-ADD ON      Result Value Range   Squamous Epithelial / LPF RARE  RARE   WBC, UA 11-20  <3 WBC/hpf   Bacteria, UA RARE  RARE   Urine-Other MUCOUS PRESENT     Ct Head Wo Contrast  11/17/2012  *RADIOLOGY REPORT*  Clinical Data: 79 year old male with headache, dizziness and hypertension.  CT HEAD WITHOUT CONTRAST  Technique:  Contiguous axial images were obtained from the base of the skull through the vertex without contrast.  Comparison: None  Findings: Mild generalized cerebral volume loss and mild chronic small vessel white matter ischemic changes are noted.  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  No acute intracranial abnormalities are  identified, including mass lesion or mass effect, hydrocephalus, extra-axial fluid collection, midline shift, hemorrhage, or acute infarction.  The visualized bony calvarium is unremarkable.  IMPRESSION: No evidence of acute intracranial abnormality.  Mild chronic small vessel white matter ischemic changes.   Original Report Authenticated By: Harmon Pier, M.D.    US Aorta  11/11/2012  *RADIOLOGY REPORT*  Clinical Data:  Dizziness, chest and abdomen pain  ULTRASOUND OF ABDOMINAL AORTA  Technique:  Ultrasound examination of the abdominal aorta was performed to evaluate for abdominal aortic aneurysm.  Comparison: None.  Abdominal Aorta:  There is no evidence of abdominal aortic aneurysm.  In maximum diameter of the abdominal aorta is 2.2 x 2.1 cm with atheromatous change present.  The mid abdominal aorta measures only 1.8 cm in diameter.  The right common iliac artery measures 1.0 cm in diameter proximally with the left common iliac artery measuring 1.1 cm.        Maximum AP diameter:  2.2 cm.       Maximum TRV diameter:  2.1 cm.  IMPRESSION: No abdominal aortic aneurysm is seen.   Original Report Authenticated By: Dwyane Dee,  M.D.    Dg Chest Port 1 View  11/11/2012  *RADIOLOGY REPORT*  Clinical Data: Dizziness  PORTABLE CHEST - 1 VIEW  Comparison: Chest radiograph 09/11/2012  Findings: Sternotomy wires overlie normal cardiac silhouette.  No effusion, trace, pneumothorax.  No osseous abnormality  IMPRESSION: No acute cardiopulmonary process.   Original Report Authenticated By: Genevive Bi, M.D.       MDM  Labs.  Reviewed nursing notes and prior charts for additional history.     Date: 11/17/2012  Rate: 59  Rhythm: normal sinus rhythm  QRS Axis: normal  Intervals: normal  ST/T Wave abnormalities: nonspecific ST/T changes  Conduction Disutrbances:none  Narrative Interpretation:   Old EKG Reviewed: changes noted t inv inf and lat changed from prior  Discussed pt, symptoms, vitals including  bradycardia, ecg changes w Dr Sharyn Lull, pts pcp/cardiologist.  He indicates pt well known to him, has only mild cad on prior study/cath, states bp med ?metoprolol was recently increased as pt had been high. He indicates as symptoms present x 2 days, hx only mild cad, and trop neg after 2 days symptoms, d/c home, he will f/u incl bradycardia, ecg changes in office in 2 days.    Recheck, no faintness/presyncope. No vertigo. No cp.   ua w moderate le, 11-20 wbc, will culture and rx.     Suzi Roots, MD 11/17/12 [phone]

## 2012-11-17 NOTE — ED Notes (Signed)
I gave the patient a warm blanket. 

## 2012-11-17 NOTE — ED Notes (Signed)
Pt was seen here last week for high blood pressure and increased BP medicine.  Pt stated having dizziness 2 days ago and then felt like he was going to fall this am and so he came in.  No other complaints.  No chest pain or sob.  Pt is on coumadin

## 2012-11-19 LAB — URINE CULTURE

## 2012-11-30 ENCOUNTER — Encounter (HOSPITAL_COMMUNITY): Payer: Self-pay | Admitting: Emergency Medicine

## 2012-11-30 ENCOUNTER — Emergency Department (HOSPITAL_COMMUNITY)
Admission: EM | Admit: 2012-11-30 | Discharge: 2012-11-30 | Disposition: A | Discharge status: Home / Self Care | Payer: Medicare Other | Attending: Emergency Medicine | Admitting: Emergency Medicine

## 2012-11-30 DIAGNOSIS — Z8719 Personal history of other diseases of the digestive system: Secondary | ICD-10-CM | POA: Insufficient documentation

## 2012-11-30 DIAGNOSIS — Z79899 Other long term (current) drug therapy: Secondary | ICD-10-CM | POA: Insufficient documentation

## 2012-11-30 DIAGNOSIS — R42 Dizziness and giddiness: Secondary | ICD-10-CM

## 2012-11-30 DIAGNOSIS — K219 Gastro-esophageal reflux disease without esophagitis: Secondary | ICD-10-CM | POA: Insufficient documentation

## 2012-11-30 DIAGNOSIS — R011 Cardiac murmur, unspecified: Secondary | ICD-10-CM | POA: Insufficient documentation

## 2012-11-30 DIAGNOSIS — Z7901 Long term (current) use of anticoagulants: Secondary | ICD-10-CM | POA: Insufficient documentation

## 2012-11-30 DIAGNOSIS — Z8679 Personal history of other diseases of the circulatory system: Secondary | ICD-10-CM | POA: Insufficient documentation

## 2012-11-30 DIAGNOSIS — Z860100 Personal history of colon polyps, unspecified: Secondary | ICD-10-CM | POA: Insufficient documentation

## 2012-11-30 DIAGNOSIS — Z954 Presence of other heart-valve replacement: Secondary | ICD-10-CM | POA: Insufficient documentation

## 2012-11-30 DIAGNOSIS — I2510 Atherosclerotic heart disease of native coronary artery without angina pectoris: Secondary | ICD-10-CM | POA: Insufficient documentation

## 2012-11-30 DIAGNOSIS — I10 Essential (primary) hypertension: Secondary | ICD-10-CM | POA: Insufficient documentation

## 2012-11-30 DIAGNOSIS — Z87891 Personal history of nicotine dependence: Secondary | ICD-10-CM | POA: Insufficient documentation

## 2012-11-30 DIAGNOSIS — E119 Type 2 diabetes mellitus without complications: Secondary | ICD-10-CM | POA: Insufficient documentation

## 2012-11-30 LAB — CBC WITH DIFFERENTIAL/PLATELET
Basophils Relative: 1 % (ref 0–1)
Eosinophils Absolute: 0.3 10*3/uL (ref 0.0–0.7)
HCT: 38.1 % — ABNORMAL LOW (ref 39.0–52.0)
Hemoglobin: 12.8 g/dL — ABNORMAL LOW (ref 13.0–17.0)
MCH: 27.1 pg (ref 26.0–34.0)
MCHC: 33.6 g/dL (ref 30.0–36.0)
MCV: 80.5 fL (ref 78.0–100.0)
Monocytes Absolute: 0.7 10*3/uL (ref 0.1–1.0)
Monocytes Relative: 11 % (ref 3–12)

## 2012-11-30 LAB — BASIC METABOLIC PANEL
BUN: 9 mg/dL (ref 6–23)
Chloride: 104 mEq/L (ref 96–112)
Creatinine, Ser: 1.03 mg/dL (ref 0.50–1.35)
GFR calc Af Amer: 79 mL/min — ABNORMAL LOW (ref >90)
GFR calc non Af Amer: 68 mL/min — ABNORMAL LOW (ref >90)

## 2012-11-30 MED ORDER — MECLIZINE HCL 12.5 MG PO TABS: 12.5000 mg | ORAL_TABLET | Freq: Three times a day (TID) | ORAL | Status: AC | PRN

## 2012-11-30 MED ORDER — CLONIDINE HCL 0.2 MG PO TABS
0.2000 mg | ORAL_TABLET | Freq: Once | ORAL | Status: AC
  Administered 2012-11-30: 0.2 mg via ORAL
  Filled 2012-11-30: qty 1

## 2012-11-30 MED ORDER — MECLIZINE HCL 25 MG PO TABS
12.5000 mg | ORAL_TABLET | Freq: Once | ORAL | Status: AC
  Administered 2012-11-30: 12.5 mg via ORAL
  Filled 2012-11-30: qty 1

## 2012-11-30 NOTE — ED Provider Notes (Signed)
History     CSN: [id number]  Arrival date & time 11/30/12  0051   First MD Initiated Contact with Patient 11/30/12 0053      Chief Complaint  Patient presents with  . Dizziness  . Hypertension    (Consider location/radiation/quality/duration/timing/severity/associated sxs/prior treatment) HPI 79 year old male presents emergency department complaining of dizziness and noted to have high blood pressure. Patient has been having intermittent dizziness over the last 2-3 months. He reports he had a [place] in December, was found to be anemic at that time. Patient has had 2 prior ED visits this month for dizziness. He was seen by his primary care doctor on Monday, taken off his metoprolol and started on amlodipine. He reports that his home health nurse checked a blood pressure this week, and reports it was 180/100. He is now taking the amlodipine twice a day. He denies any chest pain, no shortness of breath. He is not currently dizzy. He reports the dizziness lasts for a few minutes. It usually occurs when he is standing or walking. He describes the dizziness as the room spinning as if he will soon fall down. During his prior ED visits, he had CT scan of head and aortic ultrasound. The CT scan was unremarkable. Abdominal ultrasound without aneurysm. His chest x-ray was unremarkable. Patient was noted to have some orthostatic hypotension, and bradycardia during his prior 2 visits. He denies any headache, no confusion, no weakness, no numbness. No other focal neuro findings. Past Medical History  Diagnosis Date  . Heart valve disease   . Coronary artery disease   . Hypertension   . GERD (gastroesophageal reflux disease)   . Diabetes mellitus without complication   . Aortic stenosis, severe   . S/P AVR (aortic valve replacement)     on chronic coagulation  . History of colonic polyps   . GI bleed     hx of    Past Surgical History  Procedure Laterality Date  . Esophagogastroduodenoscopy   09/11/2012    Procedure: ESOPHAGOGASTRODUODENOSCOPY (EGD);  Surgeon: Theda Belfast, MD;  Location: [facility] ENDOSCOPY;  Service: Endoscopy;  Laterality: N/A;  . Cardiac surgery      valve surgery    History reviewed. No pertinent family history.  History  Substance Use Topics  . Smoking status: Former Games developer  . Smokeless tobacco: Not on file  . Alcohol Use: No      Review of Systems  All other systems reviewed and are negative.    Allergies  Review of patient's allergies indicates no known allergies.  Home Medications   Current Outpatient Rx  Name  Route  Sig  Dispense  Refill  . amLODipine (NORVASC) 2.5 MG tablet   Oral   Take 5 mg by mouth daily.         . bimatoprost (LUMIGAN) 0.01 % SOLN   Both Eyes   Place 1 drop into both eyes at bedtime.         . cloNIDine (CATAPRES) 0.2 MG tablet   Oral   Take 0.2 mg by mouth daily.         Marland Kitchen docusate sodium 100 MG CAPS   Oral   Take 100 mg by mouth 2 (two) times daily.   30 capsule   3   . esomeprazole (NEXIUM) 40 MG capsule   Oral   Take 40 mg by mouth daily before breakfast.         . ferrous sulfate (FERROUSUL) 325 (65 FE) MG tablet  Oral   Take 1 tablet (325 mg total) by mouth 3 (three) times daily with meals.   90 tablet   3   . finasteride (PROSCAR) 5 MG tablet   Oral   Take 5 mg by mouth daily.         . quinapril (ACCUPRIL) 20 MG tablet   Oral   Take 20 mg by mouth 2 (two) times daily.         . simvastatin (ZOCOR) 80 MG tablet   Oral   Take 80 mg by mouth at bedtime.         . sucralfate (CARAFATE) 1 GM/10ML suspension   Oral   Take 1 g by mouth 4 (four) times daily.         Marland Kitchen warfarin (COUMADIN) 3 MG tablet   Oral   Take 1.5 mg by mouth daily. Take with coumadin 5mg  tablet to equal 6.5 mg daily.         Marland Kitchen warfarin (COUMADIN) 5 MG tablet   Oral   Take 5 mg by mouth daily. Take with 1/2 tablet of the coumadin 3 mg tablet to equal 6.5 mg daily.         . cephALEXin  (KEFLEX) 500 MG capsule   Oral   Take 1 capsule (500 mg total) by mouth 3 (three) times daily.   21 capsule   0     BP 214/84  Pulse 81  Temp(Src) 97.8 F (36.6 C) (Oral)  Resp 15  SpO2 100%  Physical Exam  Nursing note and vitals reviewed. Constitutional: He is oriented to person, place, and time. He appears well-developed and well-nourished. No distress.  HENT:  Head: Normocephalic and atraumatic.  Right Ear: External ear normal.  Left Ear: External ear normal.  Nose: Nose normal.  Mouth/Throat: Oropharynx is clear and moist.  Eyes: Conjunctivae and EOM are normal. Pupils are equal, round, and reactive to light.  Neck: Normal range of motion. Neck supple. No JVD present. No tracheal deviation present. No thyromegaly present.  Cardiovascular: Normal rate, regular rhythm and intact distal pulses.  Exam reveals no gallop and no friction rub.   Murmur heard. Pulmonary/Chest: Effort normal and breath sounds normal. No stridor. No respiratory distress. He has no wheezes. He has no rales. He exhibits no tenderness.  Abdominal: Soft. Bowel sounds are normal. He exhibits no distension and no mass. There is no tenderness. There is no rebound and no guarding.  Musculoskeletal: Normal range of motion. He exhibits no edema and no tenderness.  Lymphadenopathy:    He has no cervical adenopathy.  Neurological: He is alert and oriented to person, place, and time. He has normal reflexes. No cranial nerve deficit. He exhibits normal muscle tone. Coordination normal.  Skin: Skin is warm and dry. No rash noted. He is not diaphoretic. No erythema. No pallor.  Psychiatric: He has a normal mood and affect. His behavior is normal. Judgment and thought content normal.    ED Course  Procedures (including critical care time)  Labs Reviewed  CBC WITH DIFFERENTIAL - Abnormal; Notable for the following:    Hemoglobin 12.8 (*)    HCT 38.1 (*)    RDW 18.2 (*)    All other components within normal  limits  BASIC METABOLIC PANEL - Abnormal; Notable for the following:    GFR calc non Af Amer 68 (*)    GFR calc Af Amer 79 (*)    All other components within normal limits  PROTIME-INR -  Abnormal; Notable for the following:    Prothrombin Time 19.2 (*)    INR 1.68 (*)    All other components within normal limits  TROPONIN I   No results found.   Date: 11/30/2012  Rate: 60  Rhythm: normal sinus rhythm  QRS Axis: normal  Intervals: normal  ST/T Wave abnormalities: nonspecific T wave changes  Conduction Disutrbances:none  Narrative Interpretation:   Old EKG Reviewed: unchanged    1. Hypertension   2. Dizziness of unknown cause       MDM  79 year old male with dizziness and systolic hypertension. His exam is nonfocal. His EKG without ST elevation. He has some lateral T-wave inversion which is unchanged from his prior EKG. Will give patient an extra dose of his clonidine as well as a half dose of meclizine for his dizziness. He may need neurology followup. Patient has had thorough evaluation during his prior to ED visit. And by patient's report this is an ongoing problem for the last 2 months. We'll check baseline labs looking for anemia or other significant possible causes for his dizziness.        Olivia Mackie, MD 11/30/12 [phone]

## 2012-11-30 NOTE — ED Notes (Signed)
Per pt and family, pt has been seen here 3 times in the last 3 weeks for same complaints. Pt is complaining of dizziness. Pt states that the episodes happen a few times through out the day. Per pt family, pt was admitted in December for symptoms, and family is concerned and wants pt to be evaluated.

## 2012-11-30 NOTE — ED Notes (Signed)
MD at bedside. 

## 2012-11-30 NOTE — ED Notes (Signed)
EDP notified of BP 

## 2012-11-30 NOTE — ED Notes (Signed)
Phlebotomy at bedside.

## 2013-09-13 ENCOUNTER — Encounter (HOSPITAL_COMMUNITY): Payer: Self-pay | Admitting: Emergency Medicine

## 2013-09-13 ENCOUNTER — Emergency Department (HOSPITAL_COMMUNITY): Payer: Medicare Other

## 2013-09-13 ENCOUNTER — Inpatient Hospital Stay (HOSPITAL_COMMUNITY)
Admission: EM | Admit: 2013-09-13 | Discharge: 2013-09-19 | DRG: 378 | Disposition: A | Discharge status: Home / Self Care | Payer: Medicare Other | Attending: Cardiovascular Disease | Admitting: Cardiovascular Disease

## 2013-09-13 DIAGNOSIS — Z954 Presence of other heart-valve replacement: Secondary | ICD-10-CM

## 2013-09-13 DIAGNOSIS — E7800 Pure hypercholesterolemia, unspecified: Secondary | ICD-10-CM | POA: Diagnosis present

## 2013-09-13 DIAGNOSIS — R791 Abnormal coagulation profile: Secondary | ICD-10-CM | POA: Diagnosis present

## 2013-09-13 DIAGNOSIS — K31811 Angiodysplasia of stomach and duodenum with bleeding: Principal | ICD-10-CM | POA: Diagnosis present

## 2013-09-13 DIAGNOSIS — Z87891 Personal history of nicotine dependence: Secondary | ICD-10-CM

## 2013-09-13 DIAGNOSIS — R195 Other fecal abnormalities: Secondary | ICD-10-CM

## 2013-09-13 DIAGNOSIS — D649 Anemia, unspecified: Secondary | ICD-10-CM | POA: Diagnosis present

## 2013-09-13 DIAGNOSIS — I10 Essential (primary) hypertension: Secondary | ICD-10-CM | POA: Diagnosis present

## 2013-09-13 DIAGNOSIS — I200 Unstable angina: Secondary | ICD-10-CM | POA: Diagnosis present

## 2013-09-13 DIAGNOSIS — K922 Gastrointestinal hemorrhage, unspecified: Secondary | ICD-10-CM | POA: Diagnosis present

## 2013-09-13 DIAGNOSIS — I359 Nonrheumatic aortic valve disorder, unspecified: Secondary | ICD-10-CM | POA: Diagnosis present

## 2013-09-13 DIAGNOSIS — Z7901 Long term (current) use of anticoagulants: Secondary | ICD-10-CM

## 2013-09-13 DIAGNOSIS — I2510 Atherosclerotic heart disease of native coronary artery without angina pectoris: Secondary | ICD-10-CM | POA: Diagnosis present

## 2013-09-13 DIAGNOSIS — E119 Type 2 diabetes mellitus without complications: Secondary | ICD-10-CM | POA: Diagnosis present

## 2013-09-13 DIAGNOSIS — R079 Chest pain, unspecified: Secondary | ICD-10-CM | POA: Diagnosis present

## 2013-09-13 DIAGNOSIS — K219 Gastro-esophageal reflux disease without esophagitis: Secondary | ICD-10-CM | POA: Diagnosis present

## 2013-09-13 HISTORY — DX: Shortness of breath: R06.02

## 2013-09-13 LAB — COMPREHENSIVE METABOLIC PANEL
ALT: 16 U/L (ref 0–53)
AST: 15 U/L (ref 0–37)
Albumin: 3 g/dL — ABNORMAL LOW (ref 3.5–5.2)
Alkaline Phosphatase: 52 U/L (ref 39–117)
CO2: 22 mEq/L (ref 19–32)
Calcium: 8.4 mg/dL (ref 8.4–10.5)
Creatinine, Ser: 1.27 mg/dL (ref 0.50–1.35)
GFR calc non Af Amer: 52 mL/min — ABNORMAL LOW (ref >90)
Sodium: 138 mEq/L (ref 135–145)
Total Protein: 6.1 g/dL (ref 6.0–8.3)

## 2013-09-13 LAB — CBC WITH DIFFERENTIAL/PLATELET
Basophils Absolute: 0 10*3/uL (ref 0.0–0.1)
Basophils Relative: 0 % (ref 0–1)
Eosinophils Absolute: 0 10*3/uL (ref 0.0–0.7)
HCT: 24.4 % — ABNORMAL LOW (ref 39.0–52.0)
Lymphs Abs: 1.5 10*3/uL (ref 0.7–4.0)
MCH: 28.9 pg (ref 26.0–34.0)
MCHC: 33.2 g/dL (ref 30.0–36.0)
Neutrophils Relative %: 73 % (ref 43–77)
Platelets: 269 10*3/uL (ref 150–400)
RBC: 2.8 MIL/uL — ABNORMAL LOW (ref 4.22–5.81)
RDW: 16.1 % — ABNORMAL HIGH (ref 11.5–15.5)

## 2013-09-13 LAB — GLUCOSE, CAPILLARY: Glucose-Capillary: 93 mg/dL (ref 70–99)

## 2013-09-13 LAB — POCT I-STAT TROPONIN I

## 2013-09-13 LAB — PROTIME-INR
INR: 3.52 — ABNORMAL HIGH (ref 0.00–1.49)
INR: 4.17 — ABNORMAL HIGH (ref 0.00–1.49)
Prothrombin Time: 34 seconds — ABNORMAL HIGH (ref 11.6–15.2)
Prothrombin Time: 38.7 seconds — ABNORMAL HIGH (ref 11.6–15.2)

## 2013-09-13 LAB — BASIC METABOLIC PANEL
BUN: 23 mg/dL (ref 6–23)
Creatinine, Ser: 1.66 mg/dL — ABNORMAL HIGH (ref 0.50–1.35)
GFR calc Af Amer: 44 mL/min — ABNORMAL LOW (ref >90)
GFR calc non Af Amer: 38 mL/min — ABNORMAL LOW (ref >90)
Potassium: 4 mEq/L (ref 3.5–5.1)

## 2013-09-13 LAB — PRO B NATRIURETIC PEPTIDE: Pro B Natriuretic peptide (BNP): 137.2 pg/mL (ref 0–450)

## 2013-09-13 LAB — TSH: TSH: 0.641 u[IU]/mL (ref 0.350–4.500)

## 2013-09-13 LAB — TROPONIN I
Troponin I: <0.3 ng/mL (ref <0.30)
Troponin I: <0.3 ng/mL (ref <0.30)

## 2013-09-13 LAB — HEMOGLOBIN A1C: Mean Plasma Glucose: 91 mg/dL (ref <117)

## 2013-09-13 LAB — CBC
HCT: 26.9 % — ABNORMAL LOW (ref 39.0–52.0)
MCHC: 33.5 g/dL (ref 30.0–36.0)
Platelets: 294 10*3/uL (ref 150–400)
RDW: 15.8 % — ABNORMAL HIGH (ref 11.5–15.5)

## 2013-09-13 MED ORDER — PANTOPRAZOLE SODIUM 40 MG PO TBEC
40.0000 mg | DELAYED_RELEASE_TABLET | Freq: Every day | ORAL | Status: DC
  Administered 2013-09-13 – 2013-09-15 (×3): 40 mg via ORAL
  Filled 2013-09-13 (×3): qty 1

## 2013-09-13 MED ORDER — NITROGLYCERIN 0.4 MG SL SUBL: 0.4000 mg | SUBLINGUAL_TABLET | SUBLINGUAL | Status: DC | PRN

## 2013-09-13 MED ORDER — ASPIRIN 300 MG RE SUPP
300.0000 mg | RECTAL | Status: AC
  Filled 2013-09-13: qty 1

## 2013-09-13 MED ORDER — ACETAMINOPHEN 325 MG PO TABS: 650.0000 mg | ORAL_TABLET | ORAL | Status: DC | PRN

## 2013-09-13 MED ORDER — MECLIZINE HCL 12.5 MG PO TABS
12.5000 mg | ORAL_TABLET | Freq: Three times a day (TID) | ORAL | Status: DC | PRN
  Filled 2013-09-13: qty 1

## 2013-09-13 MED ORDER — FUROSEMIDE 10 MG/ML IJ SOLN
40.0000 mg | Freq: Once | INTRAMUSCULAR | Status: AC
  Administered 2013-09-13: 40 mg via INTRAVENOUS
  Filled 2013-09-13: qty 4

## 2013-09-13 MED ORDER — NITROGLYCERIN 2 % TD OINT
0.5000 [in_us] | TOPICAL_OINTMENT | Freq: Four times a day (QID) | TRANSDERMAL | Status: DC
  Administered 2013-09-13 – 2013-09-16 (×12): 0.5 [in_us] via TOPICAL
  Filled 2013-09-13: qty 30

## 2013-09-13 MED ORDER — AMLODIPINE BESYLATE 5 MG PO TABS
5.0000 mg | ORAL_TABLET | Freq: Every day | ORAL | Status: DC
  Administered 2013-09-14 – 2013-09-19 (×6): 5 mg via ORAL
  Filled 2013-09-13 (×6): qty 1

## 2013-09-13 MED ORDER — METOPROLOL TARTRATE 12.5 MG HALF TABLET
12.5000 mg | ORAL_TABLET | Freq: Two times a day (BID) | ORAL | Status: DC
  Administered 2013-09-13 – 2013-09-19 (×13): 12.5 mg via ORAL
  Filled 2013-09-13 (×16): qty 1

## 2013-09-13 MED ORDER — SODIUM CHLORIDE 0.9 % IV SOLN
Freq: Once | INTRAVENOUS | Status: AC
  Administered 2013-09-13: 08:00:00 via INTRAVENOUS

## 2013-09-13 MED ORDER — ASPIRIN EC 81 MG PO TBEC
81.0000 mg | DELAYED_RELEASE_TABLET | Freq: Every day | ORAL | Status: DC
  Administered 2013-09-14: 81 mg via ORAL
  Filled 2013-09-13 (×2): qty 1

## 2013-09-13 MED ORDER — SUCRALFATE 1 GM/10ML PO SUSP
1.0000 g | Freq: Three times a day (TID) | ORAL | Status: DC
  Administered 2013-09-13 – 2013-09-19 (×25): 1 g via ORAL
  Filled 2013-09-13 (×27): qty 10

## 2013-09-13 MED ORDER — FERROUS SULFATE 325 (65 FE) MG PO TABS
325.0000 mg | ORAL_TABLET | Freq: Three times a day (TID) | ORAL | Status: DC
  Administered 2013-09-13 – 2013-09-19 (×18): 325 mg via ORAL
  Filled 2013-09-13 (×21): qty 1

## 2013-09-13 MED ORDER — ASPIRIN 81 MG PO CHEW
324.0000 mg | CHEWABLE_TABLET | ORAL | Status: AC
  Administered 2013-09-13: 324 mg via ORAL
  Filled 2013-09-13: qty 4

## 2013-09-13 MED ORDER — FINASTERIDE 5 MG PO TABS
5.0000 mg | ORAL_TABLET | Freq: Every day | ORAL | Status: DC
  Administered 2013-09-14 – 2013-09-19 (×6): 5 mg via ORAL
  Filled 2013-09-13 (×6): qty 1

## 2013-09-13 MED ORDER — DOCUSATE SODIUM 100 MG PO CAPS
100.0000 mg | ORAL_CAPSULE | Freq: Two times a day (BID) | ORAL | Status: DC
  Administered 2013-09-13 – 2013-09-19 (×11): 100 mg via ORAL
  Filled 2013-09-13 (×13): qty 1

## 2013-09-13 MED ORDER — ATORVASTATIN CALCIUM 40 MG PO TABS
40.0000 mg | ORAL_TABLET | Freq: Every day | ORAL | Status: AC
  Administered 2013-09-13: 40 mg via ORAL
  Filled 2013-09-13: qty 1

## 2013-09-13 MED ORDER — PHYTONADIONE 1 MG/0.5 ML ORAL SOLUTION
1.0000 mg | Freq: Once | ORAL | Status: AC
  Administered 2013-09-13: 1 mg via ORAL
  Filled 2013-09-13: qty 0.5

## 2013-09-13 MED ORDER — ONDANSETRON HCL 4 MG/2ML IJ SOLN: 4.0000 mg | Freq: Four times a day (QID) | INTRAMUSCULAR | Status: DC | PRN

## 2013-09-13 MED ORDER — QUINAPRIL HCL 10 MG PO TABS
20.0000 mg | ORAL_TABLET | Freq: Two times a day (BID) | ORAL | Status: DC
Start: 2013-09-13 — End: 2013-09-19
  Administered 2013-09-13 – 2013-09-19 (×12): 20 mg via ORAL
  Filled 2013-09-13 (×13): qty 2

## 2013-09-13 MED ORDER — ATORVASTATIN CALCIUM 40 MG PO TABS
40.0000 mg | ORAL_TABLET | Freq: Every day | ORAL | Status: DC
  Administered 2013-09-14 – 2013-09-18 (×5): 40 mg via ORAL
  Filled 2013-09-13 (×7): qty 1

## 2013-09-13 NOTE — ED Notes (Signed)
Family states that pt has had dizziness with cp before in past with vertigo, but bp would be elevated with those episodes. Pt also has hx of internal bleeding with low bp.

## 2013-09-13 NOTE — ED Provider Notes (Signed)
CSN: [id number]     Arrival date & time 09/13/13  0615 History   First MD Initiated Contact with Patient 09/13/13 [phone]     Chief Complaint  Patient presents with  . Chest Pain   (Consider location/radiation/quality/duration/timing/severity/associated sxs/prior Treatment) HPI Comments: Patient is a 79 year old male with a history of coronary artery disease, hypertension, diabetes mellitus, and severe aortic stenosis s/p AVR who presents today for chest pain. Patient states he awoke this morning, took his morning meds, and ate breakfast as he usually does. He states he was getting ready to leave home this AM when he suddenly felt dizzy, "weak in the stomach" and became diaphoretic. Symptoms were followed by a shortness of breath sensation and a central substernal chest pain that patient describes to be "squeezing" in nature. Chest pain was nonradiating and without modifying factors. Patient states he also felt a bit nauseous. All symptoms have improved spontaneously since onset this morning. He denies associated syncope, vision changes, jaw pain, cough, vomiting, numbness/tingling, extremity weakness, and peripheral edema. Patient further denies a personal and FHx of ACS.  PCP - Dr. Sharyn Lull  Patient is a 79 y.o. male presenting with chest pain. The history is provided by the patient. No language interpreter was used.  Chest Pain Associated symptoms: dizziness, nausea, shortness of breath and weakness ("stomach")   Associated symptoms: no abdominal pain, no fever, no numbness and not vomiting     Past Medical History  Diagnosis Date  . Heart valve disease   . Coronary artery disease   . Hypertension   . GERD (gastroesophageal reflux disease)   . Diabetes mellitus without complication   . Aortic stenosis, severe   . S/P AVR (aortic valve replacement)     on chronic coagulation  . History of colonic polyps   . GI bleed     hx of   Past Surgical History  Procedure Laterality Date  .  Esophagogastroduodenoscopy  09/11/2012    Procedure: ESOPHAGOGASTRODUODENOSCOPY (EGD);  Surgeon: Theda Belfast, MD;  Location: [facility] ENDOSCOPY;  Service: Endoscopy;  Laterality: N/A;  . Cardiac surgery      valve surgery   No family history on file. History  Substance Use Topics  . Smoking status: Former Games developer  . Smokeless tobacco: Not on file  . Alcohol Use: No    Review of Systems  Constitutional: Negative for fever.  Eyes: Negative for visual disturbance.  Respiratory: Positive for shortness of breath.   Cardiovascular: Positive for chest pain. Negative for leg swelling.  Gastrointestinal: Positive for nausea. Negative for vomiting and abdominal pain.  Neurological: Positive for dizziness and weakness ("stomach"). Negative for numbness.  All other systems reviewed and are negative.    Allergies  Review of patient's allergies indicates no known allergies.  Home Medications   Current Outpatient Rx  Name  Route  Sig  Dispense  Refill  . amLODipine (NORVASC) 2.5 MG tablet   Oral   Take 5 mg by mouth daily.         Marland Kitchen atorvastatin (LIPITOR) 40 MG tablet   Oral   Take 1 tablet by mouth daily.         . bimatoprost (LUMIGAN) 0.01 % SOLN   Both Eyes   Place 1 drop into both eyes at bedtime.         . cloNIDine (CATAPRES) 0.2 MG tablet   Oral   Take 0.2 mg by mouth daily.         Marland Kitchen docusate sodium 100  MG CAPS   Oral   Take 100 mg by mouth 2 (two) times daily.   30 capsule   3   . ferrous sulfate (FERROUSUL) 325 (65 FE) MG tablet   Oral   Take 1 tablet (325 mg total) by mouth 3 (three) times daily with meals.   90 tablet   3   . finasteride (PROSCAR) 5 MG tablet   Oral   Take 5 mg by mouth daily.         . meclizine (ANTIVERT) 12.5 MG tablet   Oral   Take 1 tablet (12.5 mg total) by mouth 3 (three) times daily as needed for dizziness.   30 tablet   0   . quinapril (ACCUPRIL) 20 MG tablet   Oral   Take 20 mg by mouth 2 (two) times daily.          . sucralfate (CARAFATE) 1 GM/10ML suspension   Oral   Take 1 g by mouth 4 (four) times daily.         Marland Kitchen warfarin (COUMADIN) 3 MG tablet   Oral   Take 0.5-1 mg by mouth daily. Take with coumadin 5mg  tablet to equal 6.5 mg daily.         Marland Kitchen warfarin (COUMADIN) 5 MG tablet   Oral   Take 5 mg by mouth daily. Take with 1/2 tablet of the coumadin 3 mg tablet to equal 6.5 mg daily.         . simvastatin (ZOCOR) 80 MG tablet   Oral   Take 80 mg by mouth at bedtime.          BP 123/45  Pulse 57  Temp(Src) 97.5 F (36.4 C) (Oral)  Resp 16  SpO2 100%  Physical Exam  Nursing note and vitals reviewed. Constitutional: He is oriented to person, place, and time. He appears well-developed and well-nourished. No distress.  HENT:  Head: Normocephalic and atraumatic.  Mouth/Throat: Oropharynx is clear and moist. No oropharyngeal exudate.  Eyes: Conjunctivae and EOM are normal. Pupils are equal, round, and reactive to light. No scleral icterus.  Neck: Normal range of motion. Neck supple.  No carotid bruits appreciated bilaterally  Cardiovascular: Normal rate, regular rhythm and intact distal pulses.   Murmur (I/VI SEM) heard. No JVD  Pulmonary/Chest: Effort normal and breath sounds normal. No respiratory distress. He has no wheezes. He has no rales.  Abdominal: Soft. He exhibits no distension. There is no tenderness. There is no rebound.  Musculoskeletal: Normal range of motion.  Neurological: He is alert and oriented to person, place, and time.  Skin: Skin is warm and dry. No rash noted. He is not diaphoretic. No erythema. No pallor.  Psychiatric: He has a normal mood and affect. His behavior is normal.    ED Course  Procedures (including critical care time) Labs Review Labs Reviewed  CBC - Abnormal; Notable for the following:    RBC 3.10 (*)    Hemoglobin 9.0 (*)    HCT 26.9 (*)    RDW 15.8 (*)    All other components within normal limits  BASIC METABOLIC PANEL -  Abnormal; Notable for the following:    Glucose, Bld 118 (*)    Creatinine, Ser 1.66 (*)    GFR calc non Af Amer 38 (*)    GFR calc Af Amer 44 (*)    All other components within normal limits  PROTIME-INR - Abnormal; Notable for the following:    Prothrombin Time 34.0 (*)  INR 3.52 (*)    All other components within normal limits  PRO B NATRIURETIC PEPTIDE  POCT I-STAT TROPONIN I   Imaging Review Dg Chest 2 View  09/13/2013   CLINICAL DATA:  Chest pain.  EXAM: CHEST  2 VIEW  COMPARISON:  Chest radiograph performed 11/11/2012  FINDINGS: The lungs are well-aerated and clear. There is no evidence of focal opacification, pleural effusion or pneumothorax.  The heart is normal in size; the patient is status post median sternotomy, with an aortic valve replacement seen. No acute osseous abnormalities are seen. There is interposition of the colon anterior to the liver. Clips are noted within the right upper quadrant, reflecting prior cholecystectomy.  IMPRESSION: No acute cardiopulmonary process seen.   Electronically Signed   By: Roanna Raider M.D.   On: 09/13/2013 07:04    EKG Interpretation    Date/Time:  Friday September 13 2013 06:23:51 EST Ventricular Rate:  84 PR Interval:  146 QRS Duration: 76 QT Interval:  354 QTC Calculation: 418 R Axis:   73 Text Interpretation:  Sinus rhythm with marked sinus arrhythmia with occasional Premature ventricular complexes Nonspecific ST and T wave abnormality Abnormal ECG Since last tracing Non-specific ST-t changes NOW PRESENT Confirmed by MILLER  MD, BRIAN ([id number]) on 09/13/2013 6:28:40 AM            MDM   1. Chest pain   2. Guaiac positive stools    79 year old male with a history of ACS and aortic stenosis s/p AVR, on chronic coumadin, presents today for chest pain with associated shortness of breath, weakness, diaphoresis, and dizziness. Symptoms have improved spontaneously without intervention. Patient well and nontoxic appearing,  hemodynamically stable, and afebrile on arrival to ED. Cardiac workup today significant only for new nonspecific ST changes on EKG, not present on prior tracing. Patient also with mild drop in hemoglobin since February 2014 with guaiac positive stool.   Upon review of patient's chart, patient was admitted for similar symptoms of weakness and shortness of breath in December 2013 at which time he was found to be anemic with heme positive stool. Possible that symptoms today may be secondary to mild anemia rather than ACS. Given EKG tracing, however, patient warrants admission for further cardiac evaluation and ACS rule out. Have consulted with Dr. Sharyn Lull who will admit. Temp admit orders placed. Patient asymptomatic at this time.    Antony Madura, [place] 09/13/13 [phone]

## 2013-09-13 NOTE — ED Notes (Signed)
Lab notified of add on

## 2013-09-13 NOTE — Progress Notes (Signed)
ANTICOAGULATION CONSULT NOTE - Initial Consult  Pharmacy Consult for coumadin Indication: AVR  No Known Allergies  Patient Measurements:   Heparin Dosing Weight:  Vital Signs: Temp: 97.9 F (36.6 C) (12/12 1400) Temp src: Oral (12/12 1400) BP: 118/46 mmHg (12/12 1400) Pulse Rate: 63 (12/12 1400)  Labs:  Recent Labs  09/13/13 0640 09/13/13 1320  HGB 9.0* 8.1*  HCT 26.9* 24.4*  PLT 294 269  APTT  --  44*  LABPROT 34.0* 38.7*  INR 3.52* 4.17*  CREATININE 1.66* 1.27  TROPONINI  --  <0.30    The CrCl is unknown because both a height and weight (above a minimum accepted value) are required for this calculation.   Medical History: Past Medical History  Diagnosis Date  . Heart valve disease   . Coronary artery disease   . Hypertension   . GERD (gastroesophageal reflux disease)   . Diabetes mellitus without complication   . Aortic stenosis, severe   . S/P AVR (aortic valve replacement)     on chronic coagulation  . History of colonic polyps   . GI bleed     hx of  . Shortness of breath     WITH ACTIVITY    Medications:  Prescriptions prior to admission  Medication Sig Dispense Refill  . amLODipine (NORVASC) 2.5 MG tablet Take 5 mg by mouth daily.      Marland Kitchen atorvastatin (LIPITOR) 40 MG tablet Take 1 tablet by mouth daily.      . bimatoprost (LUMIGAN) 0.01 % SOLN Place 1 drop into both eyes at bedtime.      . cloNIDine (CATAPRES) 0.2 MG tablet Take 0.2 mg by mouth daily.      Marland Kitchen docusate sodium 100 MG CAPS Take 100 mg by mouth 2 (two) times daily.  30 capsule  3  . ferrous sulfate (FERROUSUL) 325 (65 FE) MG tablet Take 1 tablet (325 mg total) by mouth 3 (three) times daily with meals.  90 tablet  3  . finasteride (PROSCAR) 5 MG tablet Take 5 mg by mouth daily.      . meclizine (ANTIVERT) 12.5 MG tablet Take 1 tablet (12.5 mg total) by mouth 3 (three) times daily as needed for dizziness.  30 tablet  0  . quinapril (ACCUPRIL) 20 MG tablet Take 20 mg by mouth 2 (two)  times daily.      . sucralfate (CARAFATE) 1 GM/10ML suspension Take 1 g by mouth 4 (four) times daily.      Marland Kitchen warfarin (COUMADIN) 3 MG tablet Take 0.5-1 mg by mouth daily. Take with coumadin 5mg  tablet to equal 6.5 mg daily.      Marland Kitchen warfarin (COUMADIN) 5 MG tablet Take 5 mg by mouth daily. Take with 1/2 tablet of the coumadin 3 mg tablet to equal 6.5 mg daily.      . simvastatin (ZOCOR) 80 MG tablet Take 80 mg by mouth at bedtime.       Scheduled:  . [START ON 09/14/2013] amLODipine  5 mg Oral Daily  . [START ON 09/14/2013] aspirin EC  81 mg Oral Daily  . [START ON 09/14/2013] atorvastatin  40 mg Oral q1800  . atorvastatin  40 mg Oral q1800  . docusate sodium  100 mg Oral BID  . ferrous sulfate  325 mg Oral TID WC  . [START ON 09/14/2013] finasteride  5 mg Oral Daily  . furosemide  40 mg Intravenous Once  . metoprolol tartrate  12.5 mg Oral BID  . nitroGLYCERIN  0.5 inch Topical Q6H  . pantoprazole  40 mg Oral QAC breakfast  . phytonadione  1 mg Oral Once  . quinapril  20 mg Oral BID  . sucralfate  1 g Oral TID AC & HS    Assessment: 79 yo with an AVR who was admitted for CP. He has been on coumadin at home. INR is 4.17 today. Plan is to hold coumadin until after stress test and bridge with heparin if <2.  Goal of Therapy:  INR 2-3 Monitor platelets by anticoagulation protocol: Yes   Plan:   Hold coumadin for now Daily INR F/u if heparin is needed for bridging  Clide Cliff 09/13/2013,6:45 PM

## 2013-09-13 NOTE — ED Notes (Signed)
Pt. reports mid chest pain onset this morning with SOB , denies nausea or diaphoresis . Pt. stated history of CAD / aortic valve disease - his cardiologist is Dr. Sharyn Lull .

## 2013-09-13 NOTE — ED Notes (Signed)
Pt and daughter given Cranberry juice to drink per request.

## 2013-09-13 NOTE — H&P (Signed)
[name] is an 79 y.o. male.   Chief Complaint: Chest pain HPI: Patient is 79 year old male with past medical history significant for multiple medical problems i.e. coronary artery disease, history of severe aortic stenosis status post aVR on chronic Coumadin, hypertension, diabetes mellitus, hypercholesteremia, and GERD, history of colonic polyps in the past, serum GI bleed, workup negative in the past, came to the ER complaining of retrosternal chest pain described as symptoms tightness grade 5/10 associated with feeling tired weak and short of breath. States chest pain resolved in a few minutes after resting. Denies any nausea vomiting diaphoresis. Denies any palpitation lightheadedness or syncopal episode. Patient complains of vague generalized abdominal pain off and on and being followed by GI. Denies any black tarry stools. Denies any bright red blood per rectum. Denies any cardiac workup recently. EKG done in the ER showed normal sinus rhythm with nonspecific ST-T wave changes first set of cardiac enzymes are negative  Past Medical History  Diagnosis Date  . Heart valve disease   . Coronary artery disease   . Hypertension   . GERD (gastroesophageal reflux disease)   . Diabetes mellitus without complication   . Aortic stenosis, severe   . S/P AVR (aortic valve replacement)     on chronic coagulation  . History of colonic polyps   . GI bleed     hx of    Past Surgical History  Procedure Laterality Date  . Esophagogastroduodenoscopy  09/11/2012    Procedure: ESOPHAGOGASTRODUODENOSCOPY (EGD);  Surgeon: Theda Belfast, MD;  Location: [facility] ENDOSCOPY;  Service: Endoscopy;  Laterality: N/A;  . Cardiac surgery      valve surgery    No family history on file. Social History:  reports that he has quit smoking. He does not have any smokeless tobacco history on file. He reports that he does not drink alcohol or use illicit drugs.  Allergies: No Known Allergies  Medications Prior to  Admission  Medication Sig Dispense Refill  . amLODipine (NORVASC) 2.5 MG tablet Take 5 mg by mouth daily.      Marland Kitchen atorvastatin (LIPITOR) 40 MG tablet Take 1 tablet by mouth daily.      . bimatoprost (LUMIGAN) 0.01 % SOLN Place 1 drop into both eyes at bedtime.      . cloNIDine (CATAPRES) 0.2 MG tablet Take 0.2 mg by mouth daily.      Marland Kitchen docusate sodium 100 MG CAPS Take 100 mg by mouth 2 (two) times daily.  30 capsule  3  . ferrous sulfate (FERROUSUL) 325 (65 FE) MG tablet Take 1 tablet (325 mg total) by mouth 3 (three) times daily with meals.  90 tablet  3  . finasteride (PROSCAR) 5 MG tablet Take 5 mg by mouth daily.      . meclizine (ANTIVERT) 12.5 MG tablet Take 1 tablet (12.5 mg total) by mouth 3 (three) times daily as needed for dizziness.  30 tablet  0  . quinapril (ACCUPRIL) 20 MG tablet Take 20 mg by mouth 2 (two) times daily.      . sucralfate (CARAFATE) 1 GM/10ML suspension Take 1 g by mouth 4 (four) times daily.      Marland Kitchen warfarin (COUMADIN) 3 MG tablet Take 0.5-1 mg by mouth daily. Take with coumadin 5mg  tablet to equal 6.5 mg daily.      Marland Kitchen warfarin (COUMADIN) 5 MG tablet Take 5 mg by mouth daily. Take with 1/2 tablet of the coumadin 3 mg tablet to equal 6.5 mg daily.      Marland Kitchen  simvastatin (ZOCOR) 80 MG tablet Take 80 mg by mouth at bedtime.        Results for orders placed during the hospital encounter of 09/13/13 (from the past 48 hour(s))  CBC     Status: Abnormal   Collection Time    09/13/13  6:40 AM      Result Value Range   WBC 5.7  4.0 - 10.5 K/uL   RBC 3.10 (*) 4.22 - 5.81 MIL/uL   Hemoglobin 9.0 (*) 13.0 - 17.0 g/dL   HCT 16.1 (*) 09.6 - 04.5 %   MCV 86.8  78.0 - 100.0 fL   MCH 29.0  26.0 - 34.0 pg   MCHC 33.5  30.0 - 36.0 g/dL   RDW 40.9 (*) 81.1 - 91.4 %   Platelets 294  150 - 400 K/uL  BASIC METABOLIC PANEL     Status: Abnormal   Collection Time    09/13/13  6:40 AM      Result Value Range   Sodium 139  135 - 145 mEq/L   Potassium 4.0  3.5 - 5.1 mEq/L   Chloride  105  96 - 112 mEq/L   CO2 23  19 - 32 mEq/L   Glucose, Bld 118 (*) 70 - 99 mg/dL   BUN 23  6 - 23 mg/dL   Creatinine, Ser 7.82 (*) 0.50 - 1.35 mg/dL   Calcium 9.0  8.4 - 95.6 mg/dL   GFR calc non Af Amer 38 (*) >90 mL/min   GFR calc Af Amer 44 (*) >90 mL/min   Comment: (NOTE)     The eGFR has been calculated using the CKD EPI equation.     This calculation has not been validated in all clinical situations.     eGFR's persistently <90 mL/min signify possible Chronic Kidney     Disease.  PRO B NATRIURETIC PEPTIDE     Status: None   Collection Time    09/13/13  6:40 AM      Result Value Range   Pro B Natriuretic peptide (BNP) 137.2  0 - 450 pg/mL  PROTIME-INR     Status: Abnormal   Collection Time    09/13/13  6:40 AM      Result Value Range   Prothrombin Time 34.0 (*) 11.6 - 15.2 seconds   INR 3.52 (*) 0.00 - 1.49  POCT I-STAT TROPONIN I     Status: None   Collection Time    09/13/13  6:51 AM      Result Value Range   Troponin i, poc 0.00  0.00 - 0.08 ng/mL   Comment 3            Comment: Due to the release kinetics of cTnI,     a negative result within the first hours     of the onset of symptoms does not rule out     myocardial infarction with certainty.     If myocardial infarction is still suspected,     repeat the test at appropriate intervals.   Dg Chest 2 View  09/13/2013   CLINICAL DATA:  Chest pain.  EXAM: CHEST  2 VIEW  COMPARISON:  Chest radiograph performed 11/11/2012  FINDINGS: The lungs are well-aerated and clear. There is no evidence of focal opacification, pleural effusion or pneumothorax.  The heart is normal in size; the patient is status post median sternotomy, with an aortic valve replacement seen. No acute osseous abnormalities are seen. There is interposition of the colon anterior  to the liver. Clips are noted within the right upper quadrant, reflecting prior cholecystectomy.  IMPRESSION: No acute cardiopulmonary process seen.   Electronically Signed   By:  Roanna Raider M.D.   On: 09/13/2013 07:04    Review of Systems  Constitutional: Negative for fever and chills.  Eyes: Positive for blurred vision. Negative for double vision.  Respiratory: Positive for shortness of breath. Negative for sputum production and wheezing.   Cardiovascular: Positive for chest pain. Negative for palpitations, orthopnea and claudication.  Gastrointestinal: Positive for abdominal pain. Negative for nausea and vomiting.  Genitourinary: Negative for dysuria.  Neurological: Negative for dizziness.    Blood pressure 154/50, pulse 68, temperature 97.8 F (36.6 C), temperature source Oral, resp. rate 20, SpO2 100.00%. Physical Exam  Constitutional: He is oriented to person, place, and time.  HENT:  Head: Normocephalic and atraumatic.  Eyes: Conjunctivae are normal. Left eye exhibits no discharge. No scleral icterus.  Neck: Neck supple. No JVD present. No tracheal deviation present. No thyromegaly present.  Cardiovascular: Normal rate and regular rhythm.   Murmur (soft systolic murmur and S4 gallop noted) heard. Respiratory: Breath sounds normal. No respiratory distress. He has no wheezes. He has no rales.  GI: Soft. Bowel sounds are normal. He exhibits no distension. There is no tenderness.  Musculoskeletal: He exhibits no edema and no tenderness.  Neurological: He is alert and oriented to person, place, and time.     Assessment/Plan Unstable angina rule out MI Coronary artery disease Hypertension Diabetes mellitus History of severe aortic stenosis status post aVR in the past GERD History of colonic polyp Hypercholesteremia Chronic anemia History of GI bleed in the past workup negative Plan As per orders We'll schedule for nuclear stress test for tomorrow Hold Coumadin for now, will start heparin once INR is below 2 or continue with Coumadin if nuclear stress test is negative for ischemia  Jaleena Viviani N 09/13/2013, 11:54 AM

## 2013-09-13 NOTE — Progress Notes (Signed)
UR Completed Jurnee Nakayama Graves-Bigelow, RN,BSN [phone]  

## 2013-09-14 ENCOUNTER — Observation Stay (HOSPITAL_COMMUNITY): Payer: Medicare Other

## 2013-09-14 LAB — CBC
Hemoglobin: 7.7 g/dL — ABNORMAL LOW (ref 13.0–17.0)
MCH: 29.3 pg (ref 26.0–34.0)
MCV: 87.8 fL (ref 78.0–100.0)
Platelets: 265 10*3/uL (ref 150–400)
RBC: 2.63 MIL/uL — ABNORMAL LOW (ref 4.22–5.81)
RDW: 16.2 % — ABNORMAL HIGH (ref 11.5–15.5)

## 2013-09-14 LAB — BASIC METABOLIC PANEL
BUN: 25 mg/dL — ABNORMAL HIGH (ref 6–23)
CO2: 25 mEq/L (ref 19–32)
Calcium: 8.4 mg/dL (ref 8.4–10.5)
Glucose, Bld: 111 mg/dL — ABNORMAL HIGH (ref 70–99)
Potassium: 3.9 mEq/L (ref 3.5–5.1)
Sodium: 140 mEq/L (ref 135–145)

## 2013-09-14 LAB — TROPONIN I: Troponin I: <0.3 ng/mL (ref <0.30)

## 2013-09-14 MED ORDER — REGADENOSON 0.4 MG/5ML IV SOLN
INTRAVENOUS | Status: AC
  Administered 2013-09-14: 0.4 mg
  Filled 2013-09-14: qty 5

## 2013-09-14 MED ORDER — TECHNETIUM TC 99M SESTAMIBI - CARDIOLITE
30.0000 | Freq: Once | INTRAVENOUS | Status: AC | PRN
  Administered 2013-09-14: 30 via INTRAVENOUS

## 2013-09-14 MED ORDER — PHYTONADIONE 1 MG/0.5 ML ORAL SOLUTION
1.0000 mg | Freq: Once | ORAL | Status: AC
  Administered 2013-09-14: 1 mg via ORAL
  Filled 2013-09-14 (×2): qty 0.5

## 2013-09-14 MED ORDER — TECHNETIUM TC 99M SESTAMIBI GENERIC - CARDIOLITE
10.0000 | Freq: Once | INTRAVENOUS | Status: AC | PRN
  Administered 2013-09-14: 10 via INTRAVENOUS

## 2013-09-14 NOTE — Progress Notes (Signed)
Utilization Review completed.  

## 2013-09-14 NOTE — Progress Notes (Signed)
TC from nuc med. Pt went to [facility] during testing and reported blood in stool.

## 2013-09-14 NOTE — Progress Notes (Signed)
Subjective:  Feeling better. Thinks has blood in stool. Did not call nurse to check it for blood. Afebrile.  Objective:  Vital Signs in the last 24 hours: Temp:  [97.9 F (36.6 C)-98.4 F (36.9 C)] 98.3 F (36.8 C) (12/13 0500) Pulse Rate:  [63-94] 90 (12/13 1019) Cardiac Rhythm:  [-] Normal sinus rhythm (12/13 0829) Resp:  [18-20] 18 (12/13 0500) BP: (113-160)/(41-72) 149/72 mmHg (12/13 1019) SpO2:  [100 %] 100 % (12/13 0500)  Physical Exam: BP Readings from Last 1 Encounters:  09/14/13 149/72     Wt Readings from Last 1 Encounters:  09/13/12 90.266 kg (199 lb)    Weight change:   HEENT: [place]/AT, Eyes-Brown, PERL, EOMI, Conjunctiva-Pale, Sclera-Non-icteric Neck: No JVD, No bruit, Trachea midline. Lungs:  Clear, Bilateral. Cardiac:  Regular rhythm, normal S1 and S2, no S3.  Abdomen:  Soft, non-tender. Extremities:  No edema present. No cyanosis. No clubbing. CNS: AxOx3, Cranial nerves grossly intact, moves all 4 extremities. Right handed. Skin: Warm and dry.   Intake/Output from previous day: 12/12 0701 - 12/13 0700 In: 1840 [P.O.:840; I.V.:1000] Out: 1850 [Urine:1850]    Lab Results: BMET    Component Value Date/Time   NA 138 09/13/2013 1320   K 3.8 09/13/2013 1320   CL 107 09/13/2013 1320   CO2 22 09/13/2013 1320   GLUCOSE 141* 09/13/2013 1320   BUN 20 09/13/2013 1320   CREATININE 1.27 09/13/2013 1320   CALCIUM 8.4 09/13/2013 1320   GFRNONAA 52* 09/13/2013 1320   GFRAA 61* 09/13/2013 1320   CBC    Component Value Date/Time   WBC 8.0 09/13/2013 1320   RBC 2.80* 09/13/2013 1320   HGB 8.1* 09/13/2013 1320   HCT 24.4* 09/13/2013 1320   PLT 269 09/13/2013 1320   MCV 87.1 09/13/2013 1320   MCH 28.9 09/13/2013 1320   MCHC 33.2 09/13/2013 1320   RDW 16.1* 09/13/2013 1320   LYMPHSABS 1.5 09/13/2013 1320   MONOABS 0.6 09/13/2013 1320   EOSABS 0.0 09/13/2013 1320   BASOSABS 0.0 09/13/2013 1320   CARDIAC ENZYMES Lab Results  Component Value Date   CKTOTAL  70 11/24/2010   CKMB 1.5 11/24/2010   TROPONINI <0.30 09/13/2013    Scheduled Meds: . amLODipine  5 mg Oral Daily  . aspirin EC  81 mg Oral Daily  . atorvastatin  40 mg Oral q1800  . docusate sodium  100 mg Oral BID  . ferrous sulfate  325 mg Oral TID WC  . finasteride  5 mg Oral Daily  . metoprolol tartrate  12.5 mg Oral BID  . nitroGLYCERIN  0.5 inch Topical Q6H  . pantoprazole  40 mg Oral QAC breakfast  . phytonadione  1 mg Oral Once  . quinapril  20 mg Oral BID  . sucralfate  1 g Oral TID AC & HS   Continuous Infusions:  PRN Meds:.acetaminophen, meclizine, nitroGLYCERIN, ondansetron (ZOFRAN) IV  Assessment/Plan: Chest pain  Coronary artery disease  Hypertension  Diabetes mellitus  History of severe aortic stenosis  status post AVR  GERD  History of colonic polyp  Hypercholesteremia  Chronic anemia  History of GI bleed  Supra-therapeutic INR  Hold coumadin Stool for occult blood.   LOS: 1 day    Orpah Cobb  MD  09/14/2013, 11:29 AM

## 2013-09-14 NOTE — ED Provider Notes (Signed)
Medical screening examination/treatment/procedure(s) were performed by non-physician practitioner and as supervising physician I was immediately available for consultation/collaboration.  EKG Interpretation    Date/Time:  Friday September 13 2013 06:23:51 EST Ventricular Rate:  84 PR Interval:  146 QRS Duration: 76 QT Interval:  354 QTC Calculation: 418 R Axis:   73 Text Interpretation:  Sinus rhythm with marked sinus arrhythmia with occasional Premature ventricular complexes Nonspecific ST and T wave abnormality Abnormal ECG Since last tracing Non-specific ST-t changes NOW PRESENT Confirmed by MILLER  MD, BRIAN ([id number]) on 09/13/2013 6:28:40 AM              Laray Anger, DO 09/14/13 1419

## 2013-09-15 ENCOUNTER — Inpatient Hospital Stay (HOSPITAL_COMMUNITY): Payer: Medicare Other

## 2013-09-15 DIAGNOSIS — K922 Gastrointestinal hemorrhage, unspecified: Secondary | ICD-10-CM | POA: Diagnosis present

## 2013-09-15 LAB — IRON AND TIBC
Iron: 32 ug/dL — ABNORMAL LOW (ref 42–135)
TIBC: 271 ug/dL (ref 215–435)
UIBC: 239 ug/dL (ref 125–400)

## 2013-09-15 LAB — CBC
HCT: 21.7 % — ABNORMAL LOW (ref 39.0–52.0)
Platelets: 250 10*3/uL (ref 150–400)
RDW: 16.9 % — ABNORMAL HIGH (ref 11.5–15.5)
WBC: 9.8 10*3/uL (ref 4.0–10.5)

## 2013-09-15 MED ORDER — WARFARIN SODIUM 5 MG PO TABS: 5.0000 mg | ORAL_TABLET | Freq: Once | ORAL | Status: DC

## 2013-09-15 MED ORDER — PANTOPRAZOLE SODIUM 40 MG IV SOLR
40.0000 mg | INTRAVENOUS | Status: DC
  Administered 2013-09-15 – 2013-09-17 (×3): 40 mg via INTRAVENOUS
  Filled 2013-09-15 (×4): qty 40

## 2013-09-15 MED ORDER — WARFARIN SODIUM 5 MG PO TABS
5.0000 mg | ORAL_TABLET | Freq: Once | ORAL | Status: AC
  Administered 2013-09-15: 5 mg via ORAL
  Filled 2013-09-15: qty 1

## 2013-09-15 MED ORDER — IOHEXOL 300 MG/ML  SOLN
25.0000 mL | INTRAMUSCULAR | Status: AC
  Administered 2013-09-15: 25 mL via ORAL

## 2013-09-15 MED ORDER — WARFARIN - PHARMACIST DOSING INPATIENT: Freq: Every day | Status: DC

## 2013-09-15 NOTE — Progress Notes (Signed)
Subjective:  Stool positive for occult blood. INR down to 1.6. Occasional abdominal discomfort. Passed nuclear stress test.  Objective:  Vital Signs in the last 24 hours: Temp:  [98.3 F (36.8 C)-98.6 F (37 C)] 98.5 F (36.9 C) (12/14 0500) Pulse Rate:  [71-90] 71 (12/14 0500) Cardiac Rhythm:  [-] Normal sinus rhythm (12/14 0914) Resp:  [18] 18 (12/14 0500) BP: (126-149)/(38-72) 144/71 mmHg (12/14 0500) SpO2:  [99 %-100 %] 99 % (12/14 0500)  Physical Exam: BP Readings from Last 1 Encounters:  09/15/13 144/71     Wt Readings from Last 1 Encounters:  09/13/12 90.266 kg (199 lb)    Weight change:   HEENT: [place]/AT, Eyes-Brown, PERL, EOMI, Conjunctiva-Pale, Sclera-Non-icteric Neck: No JVD, No bruit, Trachea midline. Lungs:  Clear, Bilateral. Cardiac:  Regular rhythm, normal S1 and S2, no S3.  Abdomen:  Soft, non-tender. Extremities:  No edema present. No cyanosis. No clubbing. CNS: AxOx3, Cranial nerves grossly intact, moves all 4 extremities. Right handed. Skin: Warm and dry.   Intake/Output from previous day: 12/13 0701 - 12/14 0700 In: 600 [P.O.:600] Out: -     Lab Results: BMET    Component Value Date/Time   NA 138 09/13/2013 1320   K 3.8 09/13/2013 1320   CL 107 09/13/2013 1320   CO2 22 09/13/2013 1320   GLUCOSE 141* 09/13/2013 1320   BUN 20 09/13/2013 1320   CREATININE 1.27 09/13/2013 1320   CALCIUM 8.4 09/13/2013 1320   GFRNONAA 52* 09/13/2013 1320   GFRAA 61* 09/13/2013 1320   CBC    Component Value Date/Time   WBC 9.8 09/15/2013 0315   RBC 2.44* 09/15/2013 0315   HGB 7.2* 09/15/2013 0315   HCT 21.7* 09/15/2013 0315   PLT 250 09/15/2013 0315   MCV 88.9 09/15/2013 0315   MCH 29.5 09/15/2013 0315   MCHC 33.2 09/15/2013 0315   RDW 16.9* 09/15/2013 0315   LYMPHSABS 1.5 09/13/2013 1320   MONOABS 0.6 09/13/2013 1320   EOSABS 0.0 09/13/2013 1320   BASOSABS 0.0 09/13/2013 1320   CARDIAC ENZYMES Lab Results  Component Value Date   CKTOTAL 70  11/24/2010   CKMB 1.5 11/24/2010   TROPONINI <0.30 09/13/2013    Scheduled Meds: . amLODipine  5 mg Oral Daily  . aspirin EC  81 mg Oral Daily  . atorvastatin  40 mg Oral q1800  . docusate sodium  100 mg Oral BID  . ferrous sulfate  325 mg Oral TID WC  . finasteride  5 mg Oral Daily  . metoprolol tartrate  12.5 mg Oral BID  . nitroGLYCERIN  0.5 inch Topical Q6H  . pantoprazole (PROTONIX) IV  40 mg Intravenous Q24H  . quinapril  20 mg Oral BID  . sucralfate  1 g Oral TID AC & HS  . warfarin  5 mg Oral ONCE-1800   Continuous Infusions:  PRN Meds:.acetaminophen, meclizine, nitroGLYCERIN, ondansetron (ZOFRAN) IV  Assessment/Plan: Chest pain  Coronary artery disease  Hypertension  Diabetes mellitus  History of severe aortic stenosis  status post AVR  GERD  History of colonic polyp  Hypercholesteremia  Chronic anemia  Acute on chronic GI bleed  Supra-therapeutic INR now sub-therapeutic INR  Resume coumadin without heparin use. Consider GI consult/Negative work up in past. CT abdomen/Pelvic with oral contrast. IV Protonix   LOS: 2 days    Orpah Cobb  MD  09/15/2013, 9:48 AM

## 2013-09-15 NOTE — Progress Notes (Signed)
ANTICOAGULATION CONSULT NOTE - Follow up Consult  Pharmacy Consult for coumadin Indication: AVR  No Known Allergies  Patient Measurements:   Heparin Dosing Weight:  Vital Signs: Temp: 98.5 F (36.9 C) (12/14 0500) BP: 161/56 mmHg (12/14 1011) Pulse Rate: 71 (12/14 0500)  Labs:  Recent Labs  09/13/13 09/13/13 0640 09/13/13 1320 09/13/13 1925 09/15/13 0315  HGB 7.7* 9.0* 8.1*  --  7.2*  HCT 23.1* 26.9* 24.4*  --  21.7*  PLT 265 294 269  --  250  APTT  --   --  44*  --   --   LABPROT 41.4* 34.0* 38.7*  --  18.6*  INR 4.56* 3.52* 4.17*  --  1.60*  CREATININE 1.48* 1.66* 1.27  --   --   TROPONINI <0.30  --  <0.30 <0.30  --     The CrCl is unknown because both a height and weight (above a minimum accepted value) are required for this calculation.    Assessment: 79 yo with an AVR who was admitted for CP. He has been on coumadin at home. INR was elevated on admission and has trended down to 1.6 today. Pt passed his nuclear stress test but is stool occult positive. Cardiology has started patient on IV protonix and would like to resume coumadin today. GI consulted. H/H trended down to 7.2/21/7. Plt wnl. Patient states that he has not noticed any bleeding since yesterday night.   Home coumadin dose: 6.5 mg daily    Goal of Therapy:  INR 2-3 Monitor platelets by anticoagulation protocol: Yes   Plan:  1) Coum 5 mg x 1 dose today  2) Daily INR 3) Monitor closely for signs of bleeding   Vinnie Level, PharmD.  Clinical Pharmacist Pager [phone]

## 2013-09-16 LAB — CBC
Hemoglobin: 8 g/dL — ABNORMAL LOW (ref 13.0–17.0)
MCH: 29.1 pg (ref 26.0–34.0)
MCHC: 32.4 g/dL (ref 30.0–36.0)
Platelets: 293 10*3/uL (ref 150–400)
RDW: 17.7 % — ABNORMAL HIGH (ref 11.5–15.5)
WBC: 9.1 10*3/uL (ref 4.0–10.5)

## 2013-09-16 LAB — COMPREHENSIVE METABOLIC PANEL
ALT: 15 U/L (ref 0–53)
Albumin: 3.4 g/dL — ABNORMAL LOW (ref 3.5–5.2)
Alkaline Phosphatase: 54 U/L (ref 39–117)
BUN: 16 mg/dL (ref 6–23)
Calcium: 8.9 mg/dL (ref 8.4–10.5)
Chloride: 105 mEq/L (ref 96–112)
GFR calc Af Amer: 58 mL/min — ABNORMAL LOW (ref >90)
GFR calc non Af Amer: 50 mL/min — ABNORMAL LOW (ref >90)
Glucose, Bld: 136 mg/dL — ABNORMAL HIGH (ref 70–99)
Potassium: 4.1 mEq/L (ref 3.5–5.1)
Sodium: 140 mEq/L (ref 135–145)
Total Protein: 6.6 g/dL (ref 6.0–8.3)

## 2013-09-16 LAB — PROTIME-INR
INR: 1.19 (ref 0.00–1.49)
Prothrombin Time: 14.8 seconds (ref 11.6–15.2)

## 2013-09-16 LAB — HEPARIN LEVEL (UNFRACTIONATED): Heparin Unfractionated: 0.48 IU/mL (ref 0.30–0.70)

## 2013-09-16 MED ORDER — SODIUM CHLORIDE 0.9 % IV SOLN
INTRAVENOUS | Status: DC
  Administered 2013-09-16: 20:00:00 via INTRAVENOUS
  Administered 2013-09-17: 500 mL via INTRAVENOUS

## 2013-09-16 MED ORDER — ISOSORBIDE MONONITRATE ER 30 MG PO TB24
30.0000 mg | ORAL_TABLET | Freq: Every day | ORAL | Status: DC
  Administered 2013-09-16 – 2013-09-19 (×4): 30 mg via ORAL
  Filled 2013-09-16 (×4): qty 1

## 2013-09-16 MED ORDER — HEPARIN (PORCINE) IN NACL 100-0.45 UNIT/ML-% IJ SOLN
950.0000 [IU]/h | INTRAMUSCULAR | Status: DC
  Administered 2013-09-16: 1000 [IU]/h via INTRAVENOUS
  Filled 2013-09-16 (×3): qty 250

## 2013-09-16 NOTE — Progress Notes (Signed)
Subjective:  Patient complains of vague left-sided abdominal pain. Denies any chest pain or shortness of breath. Nuclear stress test negative for ischemia. Hemoglobin remains low we'll get GI involved  Objective:  Vital Signs in the last 24 hours: Temp:  [97.7 F (36.5 C)-98.5 F (36.9 C)] 98.4 F (36.9 C) (12/15 0500) Pulse Rate:  [70-79] 75 (12/15 0956) Resp:  [18-20] 18 (12/15 0500) BP: (108-154)/(40-61) 145/53 mmHg (12/15 0956) SpO2:  [98 %-100 %] 100 % (12/15 0500)  Intake/Output from previous day: 12/14 0701 - 12/15 0700 In: 600 [P.O.:600] Out: 351 [Urine:350; Stool:1] Intake/Output from this shift:    Physical Exam: Neck: no adenopathy, no carotid bruit, no JVD and supple, symmetrical, trachea midline Lungs: clear to auscultation bilaterally Heart: regular rate and rhythm, S1, S2 normal and Metallic heart sounds soft systolic murmur noted Abdomen: soft, non-tender; bowel sounds normal; no masses,  no organomegaly Extremities: extremities normal, atraumatic, no cyanosis or edema  Lab Results:  Recent Labs  09/15/13 0315 09/16/13 0800  WBC 9.8 9.1  HGB 7.2* 8.0*  PLT 250 293    Recent Labs  09/13/13 1320 09/16/13 0800  NA 138 140  K 3.8 4.1  CL 107 105  CO2 22 24  GLUCOSE 141* 136*  BUN 20 16  CREATININE 1.27 1.31    Recent Labs  09/13/13 1320 09/13/13 1925  TROPONINI <0.30 <0.30   Hepatic Function Panel  Recent Labs  09/16/13 0800  PROT 6.6  ALBUMIN 3.4*  AST 14  ALT 15  ALKPHOS 54  BILITOT 0.5   No results found for this basename: CHOL,  in the last 72 hours No results found for this basename: PROTIME,  in the last 72 hours  Imaging: Imaging results have been reviewed and Ct Abdomen Pelvis Wo Contrast  09/15/2013   CLINICAL DATA:  History GI bleed  EXAM: CT ABDOMEN AND PELVIS WITHOUT CONTRAST  TECHNIQUE: Multidetector CT imaging of the abdomen and pelvis was performed following the standard protocol without intravenous contrast.   COMPARISON:  07/23/2005  FINDINGS: Coronary artery calcification appreciated. Emphysematous changes identified within the lung bases.  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  Noncontrast evaluation of the knee liver, spleen, adrenals, pancreas, kidneys is unremarkable.  There is no evidence of bowel obstruction no secondary signs reflecting enteritis, colitis or diverticulitis. The appendix is identified is unremarkable. There is no evidence of abdominal aortic aneurysm. Atherosclerotic calcifications are appreciated within the aorta, mesenteric and iliac vessels.  The patient is status post cholecystectomy.  There is no evidence of abdominal or pelvic masses, free fluid, adenopathy, nor loculated fluid collections. Stable renal cysts is identified within the left kidney.  IMPRESSION: No CT evidence of obstructive, inflammatory, no acute abnormalities. Incidental note is made of Chilaiditic anatomy. Atherosclerotic changes. Moderate to large amount of fecal retention.   Electronically Signed   By: Salome Holmes M.D.   On: 09/15/2013 13:52    Cardiac Studies:  Assessment/Plan:  Stable angina Coronary artery disease  Hypertension  Diabetes mellitus  History of severe aortic stenosis status post aVR in the past  GERD  History of colonic polyp  Hypercholesteremia  Acute on Chronic anemia  History of GI bleed in the past workup negative  Plan GI consult Check labs in a.m.  LOS: 3 days    Breshay Ilg N 09/16/2013, 11:42 AM

## 2013-09-16 NOTE — Progress Notes (Addendum)
ANTICOAGULATION CONSULT NOTE - Initial Consult  Pharmacy Consult for heparin Indication: AVR  No Known Allergies  Patient Measurements:  Heparin dosing weight = 83.8kg  Vital Signs: Temp: 98.2 F (36.8 C) (12/15 1243) Temp src: Oral (12/15 1243) BP: 115/44 mmHg (12/15 1243) Pulse Rate: 70 (12/15 1243)  Labs:  Recent Labs  09/13/13 1925 09/15/13 0315 09/16/13 0800  HGB  --  7.2* 8.0*  HCT  --  21.7* 24.7*  PLT  --  250 293  LABPROT  --  18.6* 14.8  INR  --  1.60* 1.19  CREATININE  --   --  1.31  TROPONINI <0.30  --   --     The CrCl is unknown because both a height and weight (above a minimum accepted value) are required for this calculation.   Medical History: Past Medical History  Diagnosis Date  . Heart valve disease   . Coronary artery disease   . Hypertension   . GERD (gastroesophageal reflux disease)   . Diabetes mellitus without complication   . Aortic stenosis, severe   . S/P AVR (aortic valve replacement)     on chronic coagulation  . History of colonic polyps   . GI bleed     hx of  . Shortness of breath     WITH ACTIVITY    Medications:  Infusions:  . heparin      Assessment: 80 yom on chronic coumadin presented to the hospital with chest pain. Pt has received 2mg  total of vitamin K. FOB+ and known history of GIB. Per discussion with Dr. Sharyn Lull, GI to see patient today and evaluate if active GIB. MD wishes to hold coumadin and start IV heparin without a bolus for now until GI determines plan. INR is subtherapeutic today at 1.19, H/H is low at 8/24.7, and plts are WNL at 293.   Goal of Therapy:  Heparin level 0.3-0.7 units/ml Monitor platelets by anticoagulation protocol: Yes   Plan:  1. Heparin gtt 1000 units/hr - no bolus 2. Check an 8 hour heparin level  3. Daily heparin level and CBC 4. F/u GI recommendations and plans for resuming coumadin  [name], Drake Leach 09/16/2013,2:08 PM

## 2013-09-16 NOTE — Consult Note (Signed)
Reason for Consult: GI bleed Referring Physician: [facility]  Cristie Hem HPI: This is a 79 year old male with multiple medical problems who is admitted for chest pain and left upper abdominal pain.  The patient was ruled out for an MI and GI was consulted to evaluate his bleed.  He has a very mild drop in his blood from the GI bleed.  The patient is heme positive.  In 09/2012 and 12/2010 he underwent endoscopic evaluations with negative results despite similar presentations.  In the past he had melena, but this is not an issue for him at this time.  Past Medical History  Diagnosis Date  . Heart valve disease   . Coronary artery disease   . Hypertension   . GERD (gastroesophageal reflux disease)   . Diabetes mellitus without complication   . Aortic stenosis, severe   . S/P AVR (aortic valve replacement)     on chronic coagulation  . History of colonic polyps   . GI bleed     hx of  . Shortness of breath     WITH ACTIVITY    Past Surgical History  Procedure Laterality Date  . Esophagogastroduodenoscopy  09/11/2012    Procedure: ESOPHAGOGASTRODUODENOSCOPY (EGD);  Surgeon: Theda Belfast, MD;  Location: [facility] ENDOSCOPY;  Service: Endoscopy;  Laterality: N/A;  . Cardiac surgery      valve surgery  . Cholecystectomy      History reviewed. No pertinent family history.  Social History:  reports that he has quit smoking. He has never used smokeless tobacco. He reports that he does not drink alcohol or use illicit drugs.  Allergies: No Known Allergies  Medications:  Scheduled: . amLODipine  5 mg Oral Daily  . atorvastatin  40 mg Oral q1800  . docusate sodium  100 mg Oral BID  . ferrous sulfate  325 mg Oral TID WC  . finasteride  5 mg Oral Daily  . isosorbide mononitrate  30 mg Oral Daily  . metoprolol tartrate  12.5 mg Oral BID  . pantoprazole (PROTONIX) IV  40 mg Intravenous Q24H  . quinapril  20 mg Oral BID  . sucralfate  1 g Oral TID AC & HS   Continuous: . heparin  1,000 Units/hr (09/16/13 1510)    Results for orders placed during the hospital encounter of 09/13/13 (from the past 24 hour(s))  PROTIME-INR     Status: None   Collection Time    09/16/13  8:00 AM      Result Value Range   Prothrombin Time 14.8  11.6 - 15.2 seconds   INR 1.19  0.00 - 1.49  CBC     Status: Abnormal   Collection Time    09/16/13  8:00 AM      Result Value Range   WBC 9.1  4.0 - 10.5 K/uL   RBC 2.75 (*) 4.22 - 5.81 MIL/uL   Hemoglobin 8.0 (*) 13.0 - 17.0 g/dL   HCT 08.6 (*) 57.8 - 46.9 %   MCV 89.8  78.0 - 100.0 fL   MCH 29.1  26.0 - 34.0 pg   MCHC 32.4  30.0 - 36.0 g/dL   RDW 62.9 (*) 52.8 - 41.3 %   Platelets 293  150 - 400 K/uL  COMPREHENSIVE METABOLIC PANEL     Status: Abnormal   Collection Time    09/16/13  8:00 AM      Result Value Range   Sodium 140  135 - 145 mEq/L   Potassium  4.1  3.5 - 5.1 mEq/L   Chloride 105  96 - 112 mEq/L   CO2 24  19 - 32 mEq/L   Glucose, Bld 136 (*) 70 - 99 mg/dL   BUN 16  6 - 23 mg/dL   Creatinine, Ser 8.11  0.50 - 1.35 mg/dL   Calcium 8.9  8.4 - 91.4 mg/dL   Total Protein 6.6  6.0 - 8.3 g/dL   Albumin 3.4 (*) 3.5 - 5.2 g/dL   AST 14  0 - 37 U/L   ALT 15  0 - 53 U/L   Alkaline Phosphatase 54  39 - 117 U/L   Total Bilirubin 0.5  0.3 - 1.2 mg/dL   GFR calc non Af Amer 50 (*) >90 mL/min   GFR calc Af Amer 58 (*) >90 mL/min     Ct Abdomen Pelvis Wo Contrast  09/15/2013   CLINICAL DATA:  History GI bleed  EXAM: CT ABDOMEN AND PELVIS WITHOUT CONTRAST  TECHNIQUE: Multidetector CT imaging of the abdomen and pelvis was performed following the standard protocol without intravenous contrast.  COMPARISON:  07/23/2005  FINDINGS: Coronary artery calcification appreciated. Emphysematous changes identified within the lung bases.  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  Noncontrast evaluation of the knee liver, spleen, adrenals, pancreas, kidneys is unremarkable.  There is no evidence of bowel obstruction no secondary signs  reflecting enteritis, colitis or diverticulitis. The appendix is identified is unremarkable. There is no evidence of abdominal aortic aneurysm. Atherosclerotic calcifications are appreciated within the aorta, mesenteric and iliac vessels.  The patient is status post cholecystectomy.  There is no evidence of abdominal or pelvic masses, free fluid, adenopathy, nor loculated fluid collections. Stable renal cysts is identified within the left kidney.  IMPRESSION: No CT evidence of obstructive, inflammatory, no acute abnormalities. Incidental note is made of Chilaiditic anatomy. Atherosclerotic changes. Moderate to large amount of fecal retention.   Electronically Signed   By: Salome Holmes M.D.   On: 09/15/2013 13:52    ROS:  As stated above in the HPI otherwise negative.  Blood pressure 115/44, pulse 70, temperature 98.2 F (36.8 C), temperature source Oral, resp. rate 17, height 5\' 11"  (1.803 m), weight 184 lb 11.9 oz (83.8 kg), SpO2 100.00%.    PE: Gen: NAD, Alert and Oriented HEENT:  [place]/AT, EOMI Neck: Supple, no LAD Lungs: CTA Bilaterally CV: RRR without M/G/R ABM: Soft, NTND, +BS Ext: No C/C/E  Assessment/Plan: 1) GI bleed. 2) Heme positive stool. 3) Anemia.  Plan: 1) Repeat EGD tomorrow.  Judye Lorino D 09/16/2013, 6:57 PM

## 2013-09-16 NOTE — Progress Notes (Signed)
ANTICOAGULATION CONSULT NOTE  Pharmacy Consult for heparin Indication: AVR  No Known Allergies  Patient Measurements: Height: 5\' 11"  (180.3 cm) Weight: 184 lb 11.9 oz (83.8 kg) IBW/kg (Calculated) : 75.3 Vital Signs: Temp: 98.2 F (36.8 C) (12/15 2027) Temp src: Oral (12/15 2027) BP: 147/41 mmHg (12/15 2027) Pulse Rate: 66 (12/15 2027)  Labs:  Recent Labs  09/15/13 0315 09/16/13 0800 09/16/13 2230  HGB 7.2* 8.0*  --   HCT 21.7* 24.7*  --   PLT 250 293  --   LABPROT 18.6* 14.8  --   INR 1.60* 1.19  --   HEPARINUNFRC  --   --  0.48  CREATININE  --  1.31  --     Estimated Creatinine Clearance: 49.5 ml/min (by C-G formula based on Cr of 1.31).   Assessment: 79 yo male with h/o AVR, Coumadin on hold, for heparin  Goal of Therapy:  Heparin level 0.3-0.7 units/ml Monitor platelets by anticoagulation protocol: Yes   Plan:  Continue Heparin at current rate Follow-up am labs.   Eddie Candle 09/16/2013,11:08 PM

## 2013-09-17 ENCOUNTER — Encounter (HOSPITAL_COMMUNITY)
Admission: EM | Disposition: A | Discharge status: Home / Self Care | Payer: Self-pay | Source: Home / Self Care | Attending: Cardiovascular Disease

## 2013-09-17 ENCOUNTER — Encounter (HOSPITAL_COMMUNITY): Payer: Self-pay | Admitting: Gastroenterology

## 2013-09-17 HISTORY — PX: ESOPHAGOGASTRODUODENOSCOPY: SHX5428

## 2013-09-17 LAB — BASIC METABOLIC PANEL
CO2: 25 mEq/L (ref 19–32)
Chloride: 105 mEq/L (ref 96–112)
GFR calc Af Amer: 53 mL/min — ABNORMAL LOW (ref >90)
GFR calc non Af Amer: 46 mL/min — ABNORMAL LOW (ref >90)
Glucose, Bld: 104 mg/dL — ABNORMAL HIGH (ref 70–99)
Potassium: 4.1 mEq/L (ref 3.5–5.1)
Sodium: 137 mEq/L (ref 135–145)

## 2013-09-17 LAB — CBC
Hemoglobin: 7.3 g/dL — ABNORMAL LOW (ref 13.0–17.0)
MCV: 90 fL (ref 78.0–100.0)
Platelets: 280 10*3/uL (ref 150–400)
RBC: 2.51 MIL/uL — ABNORMAL LOW (ref 4.22–5.81)
WBC: 9.6 10*3/uL (ref 4.0–10.5)

## 2013-09-17 LAB — PROTIME-INR: INR: 1.16 (ref 0.00–1.49)

## 2013-09-17 SURGERY — EGD (ESOPHAGOGASTRODUODENOSCOPY)
Anesthesia: Moderate Sedation

## 2013-09-17 MED ORDER — MIDAZOLAM HCL 10 MG/2ML IJ SOLN
INTRAMUSCULAR | Status: DC | PRN
  Administered 2013-09-17: 2 mg via INTRAVENOUS

## 2013-09-17 MED ORDER — MIDAZOLAM HCL 5 MG/ML IJ SOLN
INTRAMUSCULAR | Status: AC
  Filled 2013-09-17: qty 2

## 2013-09-17 MED ORDER — FENTANYL CITRATE 0.05 MG/ML IJ SOLN
INTRAMUSCULAR | Status: AC
Start: 2013-09-17 — End: 2013-09-17
  Filled 2013-09-17: qty 2

## 2013-09-17 MED ORDER — FENTANYL CITRATE 0.05 MG/ML IJ SOLN
INTRAMUSCULAR | Status: DC | PRN
  Administered 2013-09-17: 25 ug via INTRAVENOUS

## 2013-09-17 NOTE — Care Management Note (Unsigned)
    Page 1 of 1   09/18/2013     10:32:37 AM   CARE MANAGEMENT NOTE 09/18/2013  Patient:  [name],[name]   Account Number:  [id number]  Date Initiated:  09/17/2013  Documentation initiated by:  GRAVES-BIGELOW,Illana Nolting  Subjective/Objective Assessment:   Pt admitted for chest pain. Hemoccult +. EGD today.     Action/Plan:   CM to continue to monitor for disposition needs.   Anticipated DC Date:  09/19/2013   Anticipated DC Plan:  HOME/SELF CARE      DC Planning Services  CM consult      Choice offered to / List presented to:             Status of service:  In process, will continue to follow Medicare Important Message given?   (If response is "NO", the following Medicare IM given date fields will be blank) Date Medicare IM given:   Date Additional Medicare IM given:    Discharge Disposition:    Per UR Regulation:  Reviewed for med. necessity/level of care/duration of stay  If discussed at Long Length of Stay Meetings, dates discussed:   09/19/2013    Comments:  09-18-13 1030-14 09-18-13 Tomi Bamberger, RN,BSN [phone] EGD completed 09-17-13 revealed Bleeding proximal gastric AVMs- Healing small gastric erosions. Initiated on IV protonix. CM will continue to monitor for disposition needs.

## 2013-09-17 NOTE — Op Note (Signed)
[facility] [address] [place], [address]   OPERATIVE PROCEDURE REPORT  PATIENT: [name], [name]  MR#: [id number] BIRTHDATE: Jan 07, 1933  GENDER: Male ENDOSCOPIST: Jeani Hawking, MD ASSISTANT:   Priscella Mann, technician and Dwain Sarna, RN CGRN  PROCEDURE DATE: 09/17/2013 PROCEDURE:   EGD w/ control of bleeding ASA CLASS:   Class III INDICATIONS:Anemia and Heme positive stool MEDICATIONS: Versed 4 mg IV and Fentanyl 50 mcg IV TOPICAL ANESTHETIC:   none  DESCRIPTION OF PROCEDURE:   After the risks benefits and alternatives of the procedure were thoroughly explained, informed consent was obtained.  The Pentax Gastroscope [id number]  endoscope was introduced through the mouth  and advanced to the second portion of the duodenum Without limitations.      The instrument was slowly withdrawn as the mucosa was fully examined.    FINDINGS: The esophagus was normal.  In the proximal gastric body several bleeding AVMs were identified.  There was mild oozing and these sites were all treated with APC.  Scattered small healing erosions were found in the body of the stomach.  The duodenum was normal.          The scope was then withdrawn from the patient and the procedure terminated.  COMPLICATIONS: There were no complications.  IMPRESSION: 1) Bleeding proximal gastric AVMs. 2) Healing small gastric erosions.  RECOMMENDATIONS: 1) Follow HGB. 2) PPI QD. 3) Follow up with Dr. Loreta Ave in 2-4 weeks upon discharge.  _______________________________ eSigned:  Jeani Hawking, MD 09/17/2013 2:50 PM

## 2013-09-17 NOTE — Progress Notes (Signed)
ANTICOAGULATION CONSULT NOTE - Follow-up  Pharmacy Consult for heparin Indication: AVR  No Known Allergies  Patient Measurements: Height: 5\' 11"  (180.3 cm) Weight: 183 lb 13.8 oz (83.4 kg) IBW/kg (Calculated) : 75.3 Heparin dosing weight = 83.8kg  Vital Signs: Temp: 98.6 F (37 C) (12/16 0417) Temp src: Oral (12/16 0417) BP: 125/51 mmHg (12/16 0417) Pulse Rate: 68 (12/16 0417)  Labs:  Recent Labs  09/15/13 0315 09/16/13 0800 09/16/13 2230 09/17/13 0618  HGB 7.2* 8.0*  --  7.3*  HCT 21.7* 24.7*  --  22.6*  PLT 250 293  --  280  LABPROT 18.6* 14.8  --  14.6  INR 1.60* 1.19  --  1.16  HEPARINUNFRC  --   --  0.48 0.66  CREATININE  --  1.31  --   --     Estimated Creatinine Clearance: 49.5 ml/min (by C-G formula based on Cr of 1.31).  Medications:  Infusions:  . sodium chloride 20 mL/hr at 09/16/13 2015  . heparin 1,000 Units/hr (09/16/13 1510)    Assessment: 80 yom on chronic coumadin presented to the hospital with chest pain. Pt has received 2mg  total of vitamin K. FOB+ and known history of GIB. Coumadin is on hold for r/o GIB and heparin continues. Heparin level is therapeutic but at the upper end of goal range. No new bleeding noted. H/H low but stable, plts WNL. GI planning EGD today.   Goal of Therapy:  Heparin level 0.3-0.7 units/ml Monitor platelets by anticoagulation protocol: Yes   Plan:  1. Reduce heparin gtt to 950 units/hr 2. F/u AM heparin level and CBC 3. F/u EGD results and plans to resume coumadin  Lysle Pearl, PharmD, BCPS Pager # [phone] 09/17/2013 8:13 AM

## 2013-09-17 NOTE — Interval H&P Note (Signed)
History and Physical Interval Note:  09/17/2013 2:16 PM  [name]  has presented today for surgery, with the diagnosis of Anemia  The various methods of treatment have been discussed with the patient and family. After consideration of risks, benefits and other options for treatment, the patient has consented to  Procedure(s): ESOPHAGOGASTRODUODENOSCOPY (EGD) (N/A) as a surgical intervention .  The patient's history has been reviewed, patient examined, no change in status, stable for surgery.  I have reviewed the patient's chart and labs.  Questions were answered to the patient's satisfaction.     Anoop Hemmer D

## 2013-09-17 NOTE — H&P (View-Only) (Signed)
Subjective:  Patient denies any chest pain or shortness of breath states he feels weak and tired. Schedule for upper endoscopy today  Objective:  Vital Signs in the last 24 hours: Temp:  [98.2 F (36.8 C)-98.6 F (37 C)] 98.6 F (37 C) (12/16 0417) Pulse Rate:  [66-70] 68 (12/16 0417) Resp:  [17-18] 18 (12/16 0417) BP: (115-147)/(41-51) 125/51 mmHg (12/16 0417) SpO2:  [100 %] 100 % (12/16 0417) Weight:  [83.4 kg (183 lb 13.8 oz)-83.8 kg (184 lb 11.9 oz)] 83.4 kg (183 lb 13.8 oz) (12/16 0417)  Intake/Output from previous day: 12/15 0701 - 12/16 0700 In: 240 [P.O.:240] Out: 400 [Urine:400] Intake/Output from this shift:    Physical Exam: Neck: no adenopathy, no carotid bruit, no JVD and supple, symmetrical, trachea midline Lungs: clear to auscultation bilaterally Heart: regular rate and rhythm, S1, S2 normal and Metallic heart sounds soft systolic murmur noted Abdomen: soft, non-tender; bowel sounds normal; no masses,  no organomegaly Extremities: extremities normal, atraumatic, no cyanosis or edema  Lab Results:  Recent Labs  09/16/13 0800 09/17/13 0618  WBC 9.1 9.6  HGB 8.0* 7.3*  PLT 293 280    Recent Labs  09/16/13 0800 09/17/13 0618  NA 140 137  K 4.1 4.1  CL 105 105  CO2 24 25  GLUCOSE 136* 104*  BUN 16 15  CREATININE 1.31 1.42*   No results found for this basename: TROPONINI, CK, MB,  in the last 72 hours Hepatic Function Panel  Recent Labs  09/16/13 0800  PROT 6.6  ALBUMIN 3.4*  AST 14  ALT 15  ALKPHOS 54  BILITOT 0.5   No results found for this basename: CHOL,  in the last 72 hours No results found for this basename: PROTIME,  in the last 72 hours  Imaging: Imaging results have been reviewed and Ct Abdomen Pelvis Wo Contrast  09/15/2013   CLINICAL DATA:  History GI bleed  EXAM: CT ABDOMEN AND PELVIS WITHOUT CONTRAST  TECHNIQUE: Multidetector CT imaging of the abdomen and pelvis was performed following the standard protocol without  intravenous contrast.  COMPARISON:  07/23/2005  FINDINGS: Coronary artery calcification appreciated. Emphysematous changes identified within the lung bases.  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  Noncontrast evaluation of the knee liver, spleen, adrenals, pancreas, kidneys is unremarkable.  There is no evidence of bowel obstruction no secondary signs reflecting enteritis, colitis or diverticulitis. The appendix is identified is unremarkable. There is no evidence of abdominal aortic aneurysm. Atherosclerotic calcifications are appreciated within the aorta, mesenteric and iliac vessels.  The patient is status post cholecystectomy.  There is no evidence of abdominal or pelvic masses, free fluid, adenopathy, nor loculated fluid collections. Stable renal cysts is identified within the left kidney.  IMPRESSION: No CT evidence of obstructive, inflammatory, no acute abnormalities. Incidental note is made of Chilaiditic anatomy. Atherosclerotic changes. Moderate to large amount of fecal retention.   Electronically Signed   By: Salome Holmes M.D.   On: 09/15/2013 13:52    Cardiac Studies:  Assessment/Plan:  Stable angina  Coronary artery disease  Hypertension  Diabetes mellitus  History of severe aortic stenosis status post aVR in the past  GERD  History of colonic polyp  Hypercholesteremia  Acute on Chronic anemia  History of GI bleed in the past workup negative  Plan Type and crossmatch and transfuse one unit of packed RBC Schedule for endoscopy today Check labs in a.m.  LOS: 4 days    Zaheer Wageman N 09/17/2013, 10:36 AM

## 2013-09-17 NOTE — Progress Notes (Signed)
Patient on Heparin drip at 950 units/ hr Dr Elnoria Howard aware.

## 2013-09-17 NOTE — Progress Notes (Signed)
Subjective:  Patient denies any chest pain or shortness of breath states he feels weak and tired. Schedule for upper endoscopy today  Objective:  Vital Signs in the last 24 hours: Temp:  [98.2 F (36.8 C)-98.6 F (37 C)] 98.6 F (37 C) (12/16 0417) Pulse Rate:  [66-70] 68 (12/16 0417) Resp:  [17-18] 18 (12/16 0417) BP: (115-147)/(41-51) 125/51 mmHg (12/16 0417) SpO2:  [100 %] 100 % (12/16 0417) Weight:  [83.4 kg (183 lb 13.8 oz)-83.8 kg (184 lb 11.9 oz)] 83.4 kg (183 lb 13.8 oz) (12/16 0417)  Intake/Output from previous day: 12/15 0701 - 12/16 0700 In: 240 [P.O.:240] Out: 400 [Urine:400] Intake/Output from this shift:    Physical Exam: Neck: no adenopathy, no carotid bruit, no JVD and supple, symmetrical, trachea midline Lungs: clear to auscultation bilaterally Heart: regular rate and rhythm, S1, S2 normal and Metallic heart sounds soft systolic murmur noted Abdomen: soft, non-tender; bowel sounds normal; no masses,  no organomegaly Extremities: extremities normal, atraumatic, no cyanosis or edema  Lab Results:  Recent Labs  09/16/13 0800 09/17/13 0618  WBC 9.1 9.6  HGB 8.0* 7.3*  PLT 293 280    Recent Labs  09/16/13 0800 09/17/13 0618  NA 140 137  K 4.1 4.1  CL 105 105  CO2 24 25  GLUCOSE 136* 104*  BUN 16 15  CREATININE 1.31 1.42*   No results found for this basename: TROPONINI, CK, MB,  in the last 72 hours Hepatic Function Panel  Recent Labs  09/16/13 0800  PROT 6.6  ALBUMIN 3.4*  AST 14  ALT 15  ALKPHOS 54  BILITOT 0.5   No results found for this basename: CHOL,  in the last 72 hours No results found for this basename: PROTIME,  in the last 72 hours  Imaging: Imaging results have been reviewed and Ct Abdomen Pelvis Wo Contrast  09/15/2013   CLINICAL DATA:  History GI bleed  EXAM: CT ABDOMEN AND PELVIS WITHOUT CONTRAST  TECHNIQUE: Multidetector CT imaging of the abdomen and pelvis was performed following the standard protocol without  intravenous contrast.  COMPARISON:  07/23/2005  FINDINGS: Coronary artery calcification appreciated. Emphysematous changes identified within the lung bases.  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  Noncontrast evaluation of the knee liver, spleen, adrenals, pancreas, kidneys is unremarkable.  There is no evidence of bowel obstruction no secondary signs reflecting enteritis, colitis or diverticulitis. The appendix is identified is unremarkable. There is no evidence of abdominal aortic aneurysm. Atherosclerotic calcifications are appreciated within the aorta, mesenteric and iliac vessels.  The patient is status post cholecystectomy.  There is no evidence of abdominal or pelvic masses, free fluid, adenopathy, nor loculated fluid collections. Stable renal cysts is identified within the left kidney.  IMPRESSION: No CT evidence of obstructive, inflammatory, no acute abnormalities. Incidental note is made of Chilaiditic anatomy. Atherosclerotic changes. Moderate to large amount of fecal retention.   Electronically Signed   By: Hector  Cooper M.D.   On: 09/15/2013 13:52    Cardiac Studies:  Assessment/Plan:  Stable angina  Coronary artery disease  Hypertension  Diabetes mellitus  History of severe aortic stenosis status post aVR in the past  GERD  History of colonic polyp  Hypercholesteremia  Acute on Chronic anemia  History of GI bleed in the past workup negative  Plan Type and crossmatch and transfuse one unit of packed RBC Schedule for endoscopy today Check labs in a.m.  LOS: 4 days    Shateria Paternostro N 09/17/2013, 10:36 AM    

## 2013-09-18 ENCOUNTER — Encounter (HOSPITAL_COMMUNITY): Payer: Self-pay | Admitting: Gastroenterology

## 2013-09-18 LAB — PROTIME-INR
INR: 1.15 (ref 0.00–1.49)
Prothrombin Time: 14.5 seconds (ref 11.6–15.2)

## 2013-09-18 LAB — CBC
HCT: 25.9 % — ABNORMAL LOW (ref 39.0–52.0)
HCT: 28.4 % — ABNORMAL LOW (ref 39.0–52.0)
Hemoglobin: 8.6 g/dL — ABNORMAL LOW (ref 13.0–17.0)
Hemoglobin: 9.2 g/dL — ABNORMAL LOW (ref 13.0–17.0)
MCH: 29.5 pg (ref 26.0–34.0)
MCH: 28.8 pg (ref 26.0–34.0)
MCHC: 33.2 g/dL (ref 30.0–36.0)
MCHC: 32.4 g/dL (ref 30.0–36.0)
MCV: 88.7 fL (ref 78.0–100.0)
Platelets: 260 10*3/uL (ref 150–400)
Platelets: 274 10*3/uL (ref 150–400)
RBC: 2.92 MIL/uL — ABNORMAL LOW (ref 4.22–5.81)
RDW: 16.6 % — ABNORMAL HIGH (ref 11.5–15.5)
WBC: 9 10*3/uL (ref 4.0–10.5)

## 2013-09-18 LAB — BASIC METABOLIC PANEL
Calcium: 8.7 mg/dL (ref 8.4–10.5)
GFR calc Af Amer: 65 mL/min — ABNORMAL LOW (ref >90)
GFR calc non Af Amer: 56 mL/min — ABNORMAL LOW (ref >90)
Glucose, Bld: 97 mg/dL (ref 70–99)
Potassium: 4.1 mEq/L (ref 3.5–5.1)
Sodium: 135 mEq/L (ref 135–145)

## 2013-09-18 LAB — TYPE AND SCREEN
ABO/RH(D): A POS
Unit division: 0

## 2013-09-18 LAB — HEPARIN LEVEL (UNFRACTIONATED): Heparin Unfractionated: 0.51 IU/mL (ref 0.30–0.70)

## 2013-09-18 MED ORDER — PANTOPRAZOLE SODIUM 40 MG PO TBEC
40.0000 mg | DELAYED_RELEASE_TABLET | Freq: Every day | ORAL | Status: DC
  Administered 2013-09-18 – 2013-09-19 (×2): 40 mg via ORAL
  Filled 2013-09-18 (×2): qty 1

## 2013-09-18 NOTE — Progress Notes (Signed)
Subjective:  Denies any chest pain or shortness of breath complains of vague left-sided abdominal pain. Endoscopy result noted and had bleeding gastric AVM. And healing gastritis. Off anticoagulants. States still feels weak  Objective:  Vital Signs in the last 24 hours: Temp:  [98.1 F (36.7 C)-98.6 F (37 C)] 98.1 F (36.7 C) (12/17 1441) Pulse Rate:  [57-73] 65 (12/17 1441) Resp:  [16-20] 18 (12/17 1441) BP: (122-168)/(45-65) 148/65 mmHg (12/17 1441) SpO2:  [100 %] 100 % (12/17 1441) Weight:  [83.825 kg (184 lb 12.8 oz)] 83.825 kg (184 lb 12.8 oz) (12/17 0500)  Intake/Output from previous day: 12/16 0701 - 12/17 0700 In: 174.5 [Blood:174.5] Out: 1000 [Urine:1000] Intake/Output from this shift: Total I/O In: 480 [P.O.:480] Out: -   Physical Exam: Neck: no adenopathy, no carotid bruit, no JVD and supple, symmetrical, trachea midline Lungs: clear to auscultation bilaterally Heart: regular rate and rhythm, S1, S2 normal and Metallic heart sounds no S3 gallop Abdomen: soft, non-tender; bowel sounds normal; no masses,  no organomegaly Extremities: extremities normal, atraumatic, no cyanosis or edema  Lab Results:  Recent Labs  09/17/13 2243 09/18/13 0700  WBC 9.0 13.3*  HGB 8.6* 9.2*  PLT 260 274    Recent Labs  09/17/13 0618 09/18/13 0700  NA 137 135  K 4.1 4.1  CL 105 103  CO2 25 22  GLUCOSE 104* 97  BUN 15 12  CREATININE 1.42* 1.20   No results found for this basename: TROPONINI, CK, MB,  in the last 72 hours Hepatic Function Panel  Recent Labs  09/16/13 0800  PROT 6.6  ALBUMIN 3.4*  AST 14  ALT 15  ALKPHOS 54  BILITOT 0.5   No results found for this basename: CHOL,  in the last 72 hours No results found for this basename: PROTIME,  in the last 72 hours  Imaging: Imaging results have been reviewed and No results found.  Cardiac Studies:  Assessment/Plan:  Stable angina  Coronary artery disease  Hypertension  Diabetes mellitus  History of  severe aortic stenosis status post aVR in the past  GERD  History of colonic polyp  Hypercholesteremia Status post upper GI bleed secondary to bleeding gastric AVM/gastritis  Acute on Chronic anemia secondary to above History of GI bleed in the past workup negative  Plan Advanced in diet as per orders Check CBC in a.m. and possible discharge in hemoglobin stable  LOS: 5 days    [name] N 09/18/2013, 5:28 PM

## 2013-09-18 NOTE — Progress Notes (Signed)
Subjective: No acute events.  Still feels weak.  Objective: Vital signs in last 24 hours: Temp:  [97.9 F (36.6 C)-98.6 F (37 C)] 98.3 F (36.8 C) (12/17 0500) Pulse Rate:  [57-73] 70 (12/17 0500) Resp:  [14-20] 18 (12/17 0500) BP: (83-168)/(23-65) 132/53 mmHg (12/17 0500) SpO2:  [99 %-100 %] 100 % (12/17 0500) Weight:  [184 lb (83.462 kg)-184 lb 12.8 oz (83.825 kg)] 184 lb 12.8 oz (83.825 kg) (12/17 0500) Last BM Date: 09/16/13  Intake/Output from previous day: 12/16 0701 - 12/17 0700 In: 174.5 [Blood:174.5] Out: 1000 [Urine:1000] Intake/Output this shift:    General appearance: alert and no distress GI: soft, non-tender; bowel sounds normal; no masses,  no organomegaly  Lab Results:  Recent Labs  09/16/13 0800 09/17/13 0618 09/17/13 2243  WBC 9.1 9.6 9.0  HGB 8.0* 7.3* 8.6*  HCT 24.7* 22.6* 25.9*  PLT 293 280 260   BMET  Recent Labs  09/16/13 0800 09/17/13 0618  NA 140 137  K 4.1 4.1  CL 105 105  CO2 24 25  GLUCOSE 136* 104*  BUN 16 15  CREATININE 1.31 1.42*  CALCIUM 8.9 8.9   LFT  Recent Labs  09/16/13 0800  PROT 6.6  ALBUMIN 3.4*  AST 14  ALT 15  ALKPHOS 54  BILITOT 0.5   PT/INR  Recent Labs  09/16/13 0800 09/17/13 0618  LABPROT 14.8 14.6  INR 1.19 1.16   Hepatitis Panel No results found for this basename: HEPBSAG, HCVAB, HEPAIGM, HEPBIGM,  in the last 72 hours C-Diff No results found for this basename: CDIFFTOX,  in the last 72 hours Fecal Lactopherrin No results found for this basename: FECLLACTOFRN,  in the last 72 hours  Studies/Results: No results found.  Medications:  Scheduled: . amLODipine  5 mg Oral Daily  . atorvastatin  40 mg Oral q1800  . docusate sodium  100 mg Oral BID  . ferrous sulfate  325 mg Oral TID WC  . finasteride  5 mg Oral Daily  . isosorbide mononitrate  30 mg Oral Daily  . metoprolol tartrate  12.5 mg Oral BID  . pantoprazole (PROTONIX) IV  40 mg Intravenous Q24H  . quinapril  20 mg Oral BID   . sucralfate  1 g Oral TID AC & HS   Continuous: . heparin 950 Units/hr (09/17/13 0900)    Assessment/Plan: 1) Bleeding proximal gastric AVMs. 2) Healing gastric erosions.   HGB stable, but he is still on heparin.  I wanted the patient to remain on heparin for the EGD as the source of his bleeding in the past was always elusive.  Hopefully the treatment of the AVMs will resolve his bleeding.    Plan: 1) Follow HGB. 2) Transfuse as necessary. 3) Hold heparin and other anticoagulants if possible.   LOS: 5 days   Meerab Maselli D 09/18/2013, 7:34 AM

## 2013-09-18 NOTE — Progress Notes (Signed)
ANTICOAGULATION CONSULT NOTE - Follow-up  Pharmacy Consult for heparin Indication: AVR  No Known Allergies  Patient Measurements: Height: 5\' 11"  (180.3 cm) Weight: 184 lb 12.8 oz (83.825 kg) IBW/kg (Calculated) : 75.3 Heparin dosing weight = 83.8kg  Vital Signs: Temp: 98.3 F (36.8 C) (12/17 0500) Temp src: Oral (12/17 0500) BP: 132/53 mmHg (12/17 0500) Pulse Rate: 70 (12/17 0500)  Labs:  Recent Labs  09/16/13 0800 09/16/13 2230 09/17/13 0618 09/17/13 2243 09/18/13 0700  HGB 8.0*  --  7.3* 8.6* 9.2*  HCT 24.7*  --  22.6* 25.9* 28.4*  PLT 293  --  280 260 274  LABPROT 14.8  --  14.6  --  14.5  INR 1.19  --  1.16  --  1.15  HEPARINUNFRC  --  0.48 0.66  --  0.51  CREATININE 1.31  --  1.42*  --   --     Estimated Creatinine Clearance: 45.7 ml/min (by C-G formula based on Cr of 1.42).  Medications:  Infusions:  . heparin 950 Units/hr (09/17/13 0900)    Assessment: 80 yom on chronic coumadin for AVR presented to the hospital with chest pain. Pt has received 2mg  total of vitamin K. FOB+ and known history of GIB. Coumadin is on hold for r/o GIB and heparin continues. Heparin level is therapeutic today. GI performed and EGD yesterday which revealed bleeding proximal gastric AVMs and healing gastric erosions. GI recommends holding anticoagulants.   Goal of Therapy:  Heparin level 0.3-0.7 units/ml Monitor platelets by anticoagulation protocol: Yes   Plan:  1. Continue heparin gtt at 950 units/hr - MD please clarify anticoagulation plans 2. F/u AM heparin level and CBC  Lysle Pearl, PharmD, BCPS Pager # [phone] 09/18/2013 8:14 AM

## 2013-09-19 LAB — CBC
HCT: 28.7 % — ABNORMAL LOW (ref 39.0–52.0)
Hemoglobin: 9.6 g/dL — ABNORMAL LOW (ref 13.0–17.0)
MCH: 29.3 pg (ref 26.0–34.0)
MCHC: 33.4 g/dL (ref 30.0–36.0)
Platelets: 274 10*3/uL (ref 150–400)
RBC: 3.28 MIL/uL — ABNORMAL LOW (ref 4.22–5.81)
WBC: 9.1 10*3/uL (ref 4.0–10.5)

## 2013-09-19 LAB — BASIC METABOLIC PANEL
CO2: 23 mEq/L (ref 19–32)
Calcium: 8.8 mg/dL (ref 8.4–10.5)
GFR calc non Af Amer: 49 mL/min — ABNORMAL LOW (ref >90)
Potassium: 4 mEq/L (ref 3.5–5.1)
Sodium: 133 mEq/L — ABNORMAL LOW (ref 135–145)

## 2013-09-19 LAB — PROTIME-INR
INR: 1.07 (ref 0.00–1.49)
Prothrombin Time: 13.7 seconds (ref 11.6–15.2)

## 2013-09-19 MED ORDER — PANTOPRAZOLE SODIUM 40 MG PO TBEC: 40.0000 mg | DELAYED_RELEASE_TABLET | Freq: Every day | ORAL | Status: DC

## 2013-09-19 NOTE — Discharge Summary (Signed)
  Redictated on 09/19/2013 dictation number is [id number]

## 2013-09-19 NOTE — Progress Notes (Signed)
Subjective: Feeling better today.    Objective: Vital signs in last 24 hours: Temp:  [98.1 F (36.7 C)-99.1 F (37.3 C)] 98.7 F (37.1 C) (12/18 0507) Pulse Rate:  [64-73] 66 (12/18 0507) Resp:  [18-20] 18 (12/18 0507) BP: (133-158)/(55-77) 133/73 mmHg (12/18 0507) SpO2:  [99 %-100 %] 100 % (12/18 0507) Last BM Date: 09/17/13  Intake/Output from previous day: 12/17 0701 - 12/18 0700 In: 840 [P.O.:840] Out: -  Intake/Output this shift:    General appearance: alert and no distress GI: soft, non-tender; bowel sounds normal; no masses,  no organomegaly  Lab Results:  Recent Labs  09/17/13 2243 09/18/13 0700 09/19/13 0535  WBC 9.0 13.3* 9.1  HGB 8.6* 9.2* 9.6*  HCT 25.9* 28.4* 28.7*  PLT 260 274 274   BMET  Recent Labs  09/17/13 0618 09/18/13 0700 09/19/13 0535  NA 137 135 133*  K 4.1 4.1 4.0  CL 105 103 101  CO2 25 22 23   GLUCOSE 104* 97 99  BUN 15 12 10   CREATININE 1.42* 1.20 1.34  CALCIUM 8.9 8.7 8.8   LFT  Recent Labs  09/16/13 0800  PROT 6.6  ALBUMIN 3.4*  AST 14  ALT 15  ALKPHOS 54  BILITOT 0.5   PT/INR  Recent Labs  09/18/13 0700 09/19/13 0535  LABPROT 14.5 13.7  INR 1.15 1.07   Hepatitis Panel No results found for this basename: HEPBSAG, HCVAB, HEPAIGM, HEPBIGM,  in the last 72 hours C-Diff No results found for this basename: CDIFFTOX,  in the last 72 hours Fecal Lactopherrin No results found for this basename: FECLLACTOFRN,  in the last 72 hours  Studies/Results: No results found.  Medications:  Scheduled: . amLODipine  5 mg Oral Daily  . atorvastatin  40 mg Oral q1800  . docusate sodium  100 mg Oral BID  . ferrous sulfate  325 mg Oral TID WC  . finasteride  5 mg Oral Daily  . isosorbide mononitrate  30 mg Oral Daily  . metoprolol tartrate  12.5 mg Oral BID  . pantoprazole  40 mg Oral Daily  . quinapril  20 mg Oral BID  . sucralfate  1 g Oral TID AC & HS   Continuous:   Assessment/Plan: 1) GI bleed.  Presumed to  be gastric AVMs. 2) Cardiac valve.   In the long term he will require anticoagulation, but if he can be off for the next 5-7 days, it will be helpful.  This will allow time for healing of the gastric mucosa from the ablation.  Plan: 1) D/C Home per Dr. Sharyn Lull. 2) Signing off.   LOS: 6 days   [name] D 09/19/2013, 7:27 AM

## 2013-09-20 NOTE — Discharge Summary (Signed)
NAMENAYIB, REMER NO.:  [id number]  MEDICAL RECORD NO.:  [id number]  LOCATION:  3W06C                        FACILITY:  [facility]  PHYSICIAN:  Letonia Stead N. Sharyn Lull, M.D. DATE OF BIRTH:  04-03-1933  DATE OF ADMISSION:  09/13/2013 DATE OF DISCHARGE:  09/19/2013                              DISCHARGE SUMMARY   ADMITTING DIAGNOSES: 1. Unstable angina, rule out myocardial infarction. 2. Coronary artery disease. 3. Hypertension. 4. Diabetes mellitus. 5. History of severe aortic stenosis status post aortic valve     replacement in the past. 6. Gastroesophageal reflux disease. 7. History of colonic polyps. 8. Hypercholesteremia. 9. Chronic anemia. 10.History of gastrointestinal bleeding in the past, workup negative.  FINAL DIAGNOSES: 1. Stable angina, myocardial infarction ruled out.  Negative nuclear     stress test. 2. Coronary artery disease. 3. Hypertension. 4. Diabetes mellitus. 5. History of severe aortic stenosis status post aortic valve     replacement in the past. 6. Acute on chronic anemia secondary to upper gastrointestinal bleed     secondary to bleeding antral arteriovenous malformation. 7. Status post esophagogastroduodenoscopy. 8. Gastroesophageal reflux disease. 9. Antral gastritis. 10.History of colonic polyps. 11.Hypercholesteremia. 12.Chronic anemia. 13.History of gastrointestinal bleed in the past, workup negative in     the past.  DISCHARGE HOME MEDICATIONS: 1. Protonix 40 mg 1 tablet daily. 2. Amlodipine 2.5 mg 1 tablet daily. 3. Atorvastatin 40 mg 1 tablet daily. 4. Lumigan eyedrops as before. 5. Clonidine 0.2 mg daily as before. 6. DSS 100 mg capsule daily. 7. Ferrous sulfate 325 mg 1 tablet 3 times daily. 8. Proscar 5 mg 1 tablet daily. 9. Meclizine 12.5 mg 3 times daily. 10.Accupril 20 mg twice daily. 11.Carafate 1 g 4 times daily. 12.The patient has been advised to stop simvastatin and warfarin.  DIET:  Low-salt,  low-cholesterol 1800 calories ADA diet.  FOLLOWUP:  With me in 1 week.  CONDITION AT DISCHARGE:  Stable.  Follow with GI in 2-4 weeks.  The patient has been advised to call 911, if he notices black tarry stools, feels dizzy, weak, lightheaded.  BRIEF HISTORY AND HOSPITAL COURSE:  Mr. Enyeart is a 79 year old male with past medical history significant for multiple medical problems, i.e., coronary artery disease, history of severe aortic stenosis status post AVR on chronic Coumadin, hypertension, diabetes mellitus, hypercholesteremia, GERD, history of colonic polyps in the past, history of upper GI bleed, workup negative in the past.  He came to the ER complaining of retrosternal chest pain, described as severe tightness, grade 5/10 associated with feeling weak, tired, and short of breath. The patient states chest pain resolved in few minutes after resting. Denies any nausea, vomiting, diaphoresis.  Denies palpitation, lightheadedness, or syncopal episode.  The patient also complains of generalized vague abdominal pain off and on and is being followed by GI. The patient denies any black tarry stools, red blood per rectum.  Denies any cardiac workup recently.  EKG done in the ER showed normal sinus rhythm with nonspecific ST-T wave changes.  First set of cardiac enzymes were negative.  PHYSICAL EXAMINATION:  VITAL SIGNS:  Blood pressure was 154/50, pulse was 68.  He was afebrile.  Conjunctivae was pink.  NECK:  Supple.  No JVD.  No bruit. LUNGS:  Clear to auscultation without rhonchi or rales. CARDIOVASCULAR:  S1, S2 was normal.  There was soft systolic murmur and S4 gallop. ABDOMEN:  Soft.  Bowel sounds present, nontender. EXTREMITIES:  There is no clubbing, cyanosis, or edema.  LABORATORY DATA:  Sodium was 140, potassium 3.9, BUN 25, creatinine 1.48, glucose was 111.  Three sets of cardiac enzymes were negative. ProBNP was 137, hemoglobin was 7.7, hematocrit 23.1, repeat  hemoglobin was 9, hematocrit 26.9.  His PT was 41.4.  INR 4.56.  Repeat PT was 18.61, INR 1.60; late after that PT was 14.8, INR 1.19.  His repeat hemoglobin was 7.3, hematocrit 22.6, on September 17, 2013,  received 1 unit of packed RBCs, post transfusion, his hemoglobin has been stable. Hemoglobin 8.6, hematocrit 25.9.  Repeat hemoglobin 9.2, hematocrit 28.4, today hemoglobin is 9.6, hematocrit 28.7.  The patient also underwent Lexiscan Myoview which showed no evidence of myocardial ischemia, normal wall motion, EF of 69%.  Fecal occult blood was positive.  TSH was 0.641.  BRIEF HOSPITAL COURSE:  The patient was admitted to telemetry unit.  MI was ruled out by serial enzymes and EKG.  The patient subsequently underwent Lexiscan Myoview which showed no evidence of ischemia with normal LV systolic function and wall motion.  The patient continued to have vague abdominal pain and had significant drop in hemoglobin requiring 1 unit of packed RBCs.  GI consultation was obtained.  His Coumadin was held and was started on IV heparin.  The patient underwent upper endoscopy, which showed bleeding AVM in the antrum and also healing antral gastritis.  GI recommendation was to stop all anticoagulants in view of significant recurrent GI bleed.  The patient's hemoglobin has been stable for last 2 days.  His diet was gradually advanced and tolerating it well.  The patient will be discharged home on above medications and will be followed up in my office in 1 week and GI in 2-4 weeks.     Eduardo Osier. Sharyn Lull, M.D.     [facility]/MED[facility]  D:  09/19/2013  T:  09/20/2013  Job:  [id number]

## 2013-12-10 IMAGING — CR DG CHEST 1V PORT
1 series · 1 of 1 positions shown · non-contrast
Comparison: Chest radiograph 09/11/2012

CLINICAL DATA: Dizziness

PORTABLE CHEST - 1 VIEW

[AP]
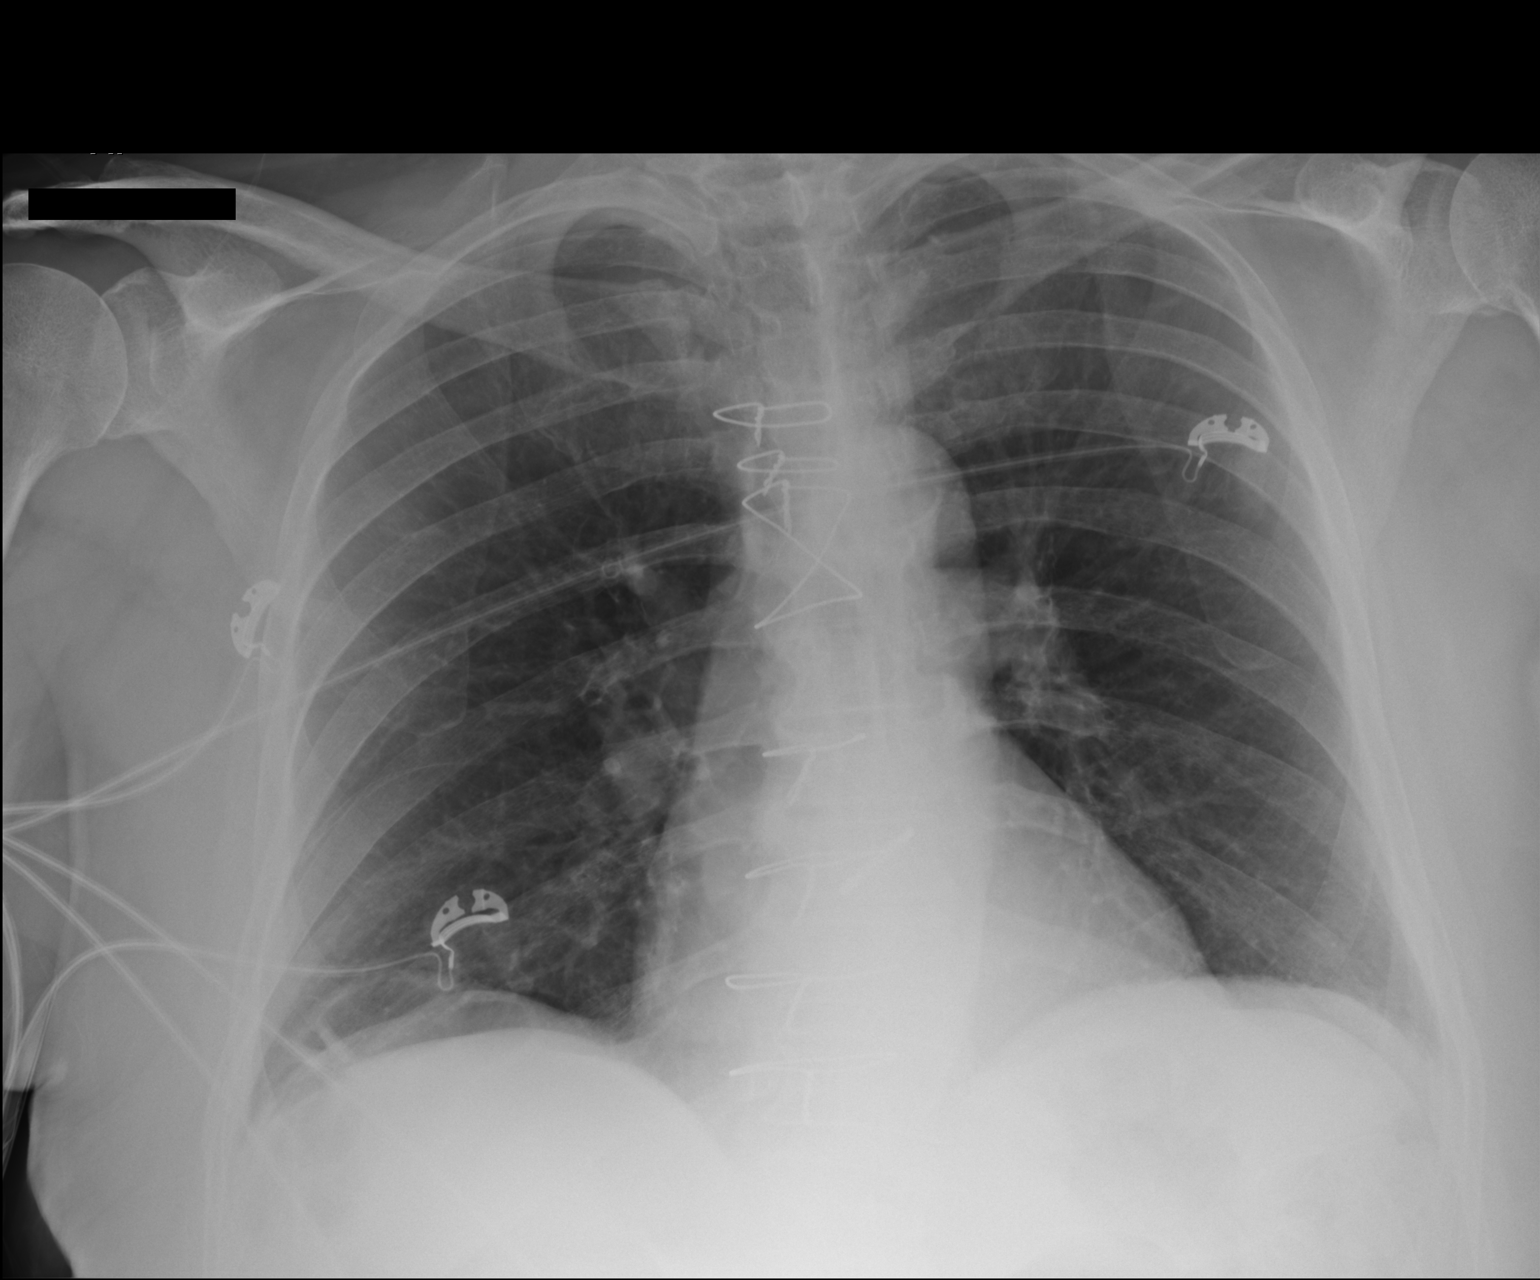

[1 of 1 positions shown; findings below may reference images not displayed]

FINDINGS: Sternotomy wires overlie normal cardiac silhouette.  No
effusion, trace, pneumothorax..  No osseous abnormality
IMPRESSION: No acute cardiopulmonary process.

## 2013-12-10 IMAGING — US US AORTA
1 series · 14 of 16 positions shown · non-contrast
Comparison: None.

CLINICAL DATA: Dizziness, chest and abdomen pain

ULTRASOUND OF ABDOMINAL AORTA
TECHNIQUE: Ultrasound examination of the abdominal aorta was
performed to evaluate for abdominal aortic aneurysm.

[Series 1: us aorta · 0.30mm/px · 14 of 16 slices shown]
[im 1/16]
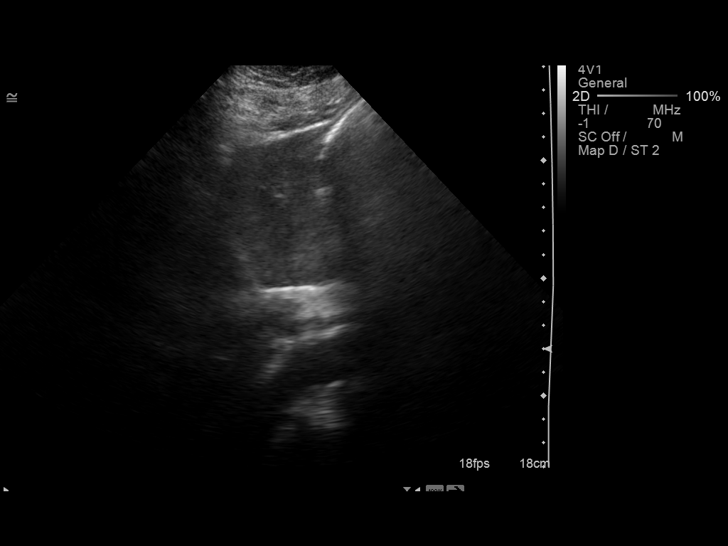
[im 2/16]
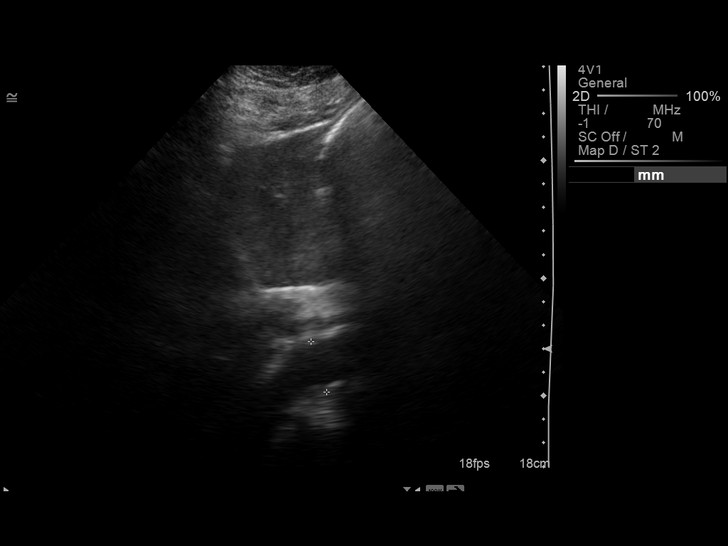
[im 3/16]
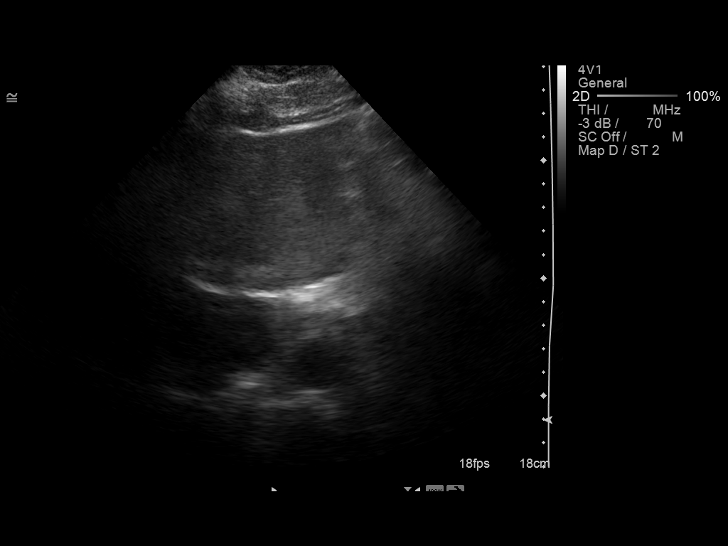
[im 5/16]
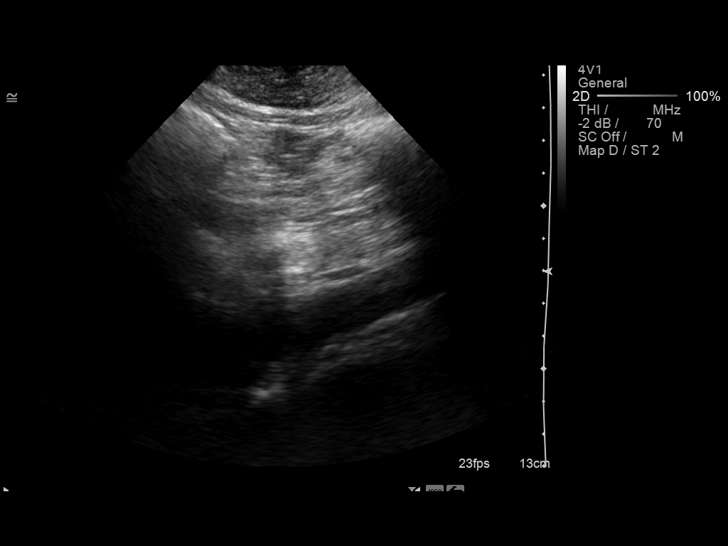
[im 6/16]
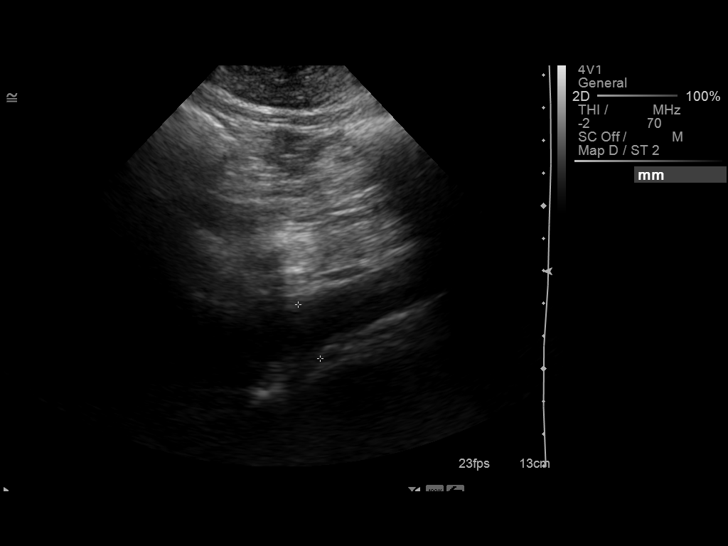
[im 7/16]
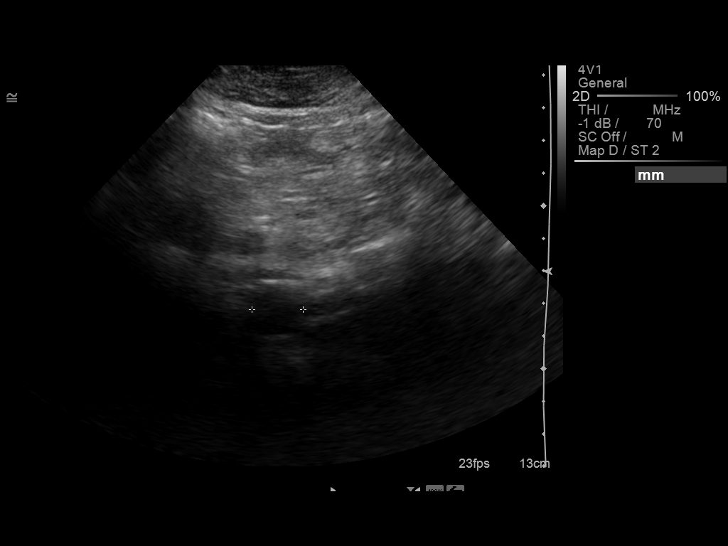
[im 8/16]
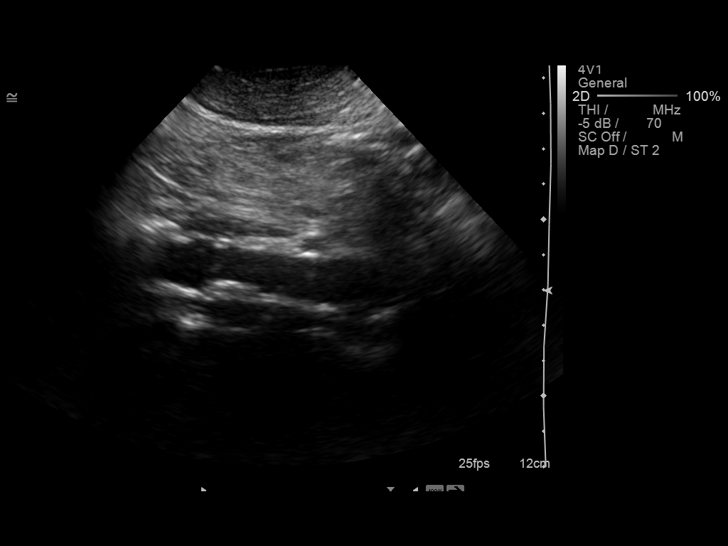
[im 9/16]
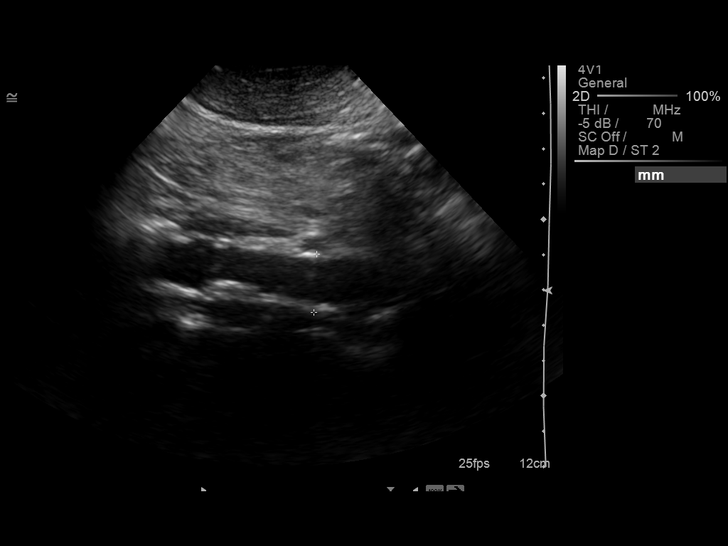
[im 10/16]
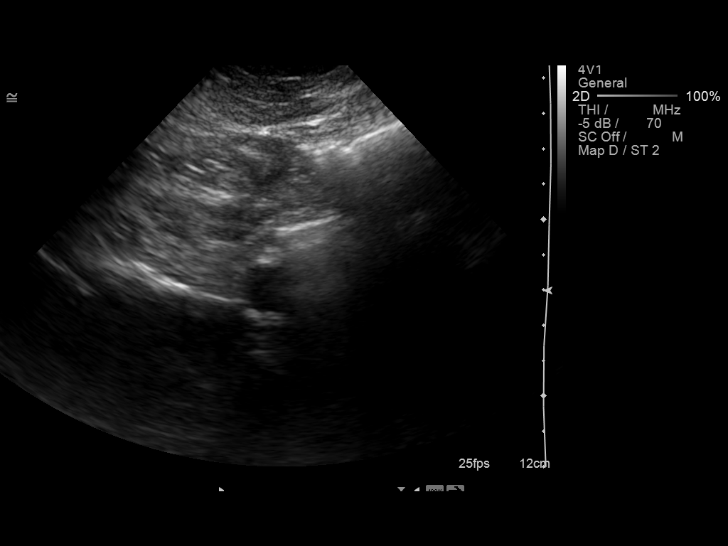
[im 11/16]
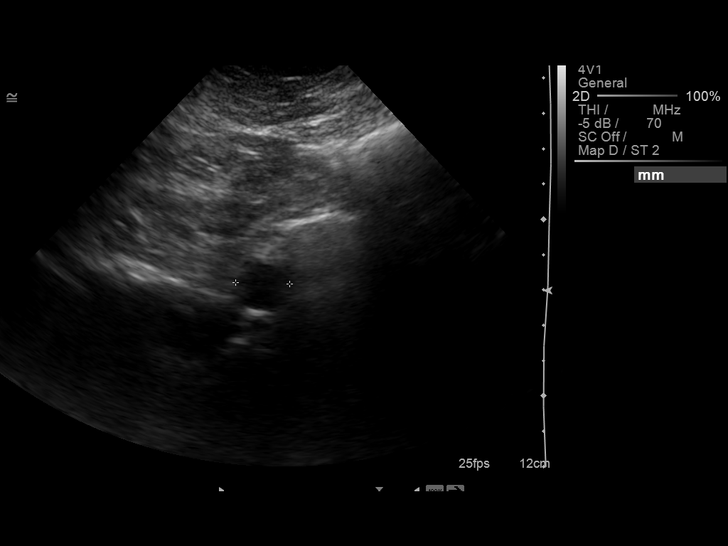
[im 13/16]
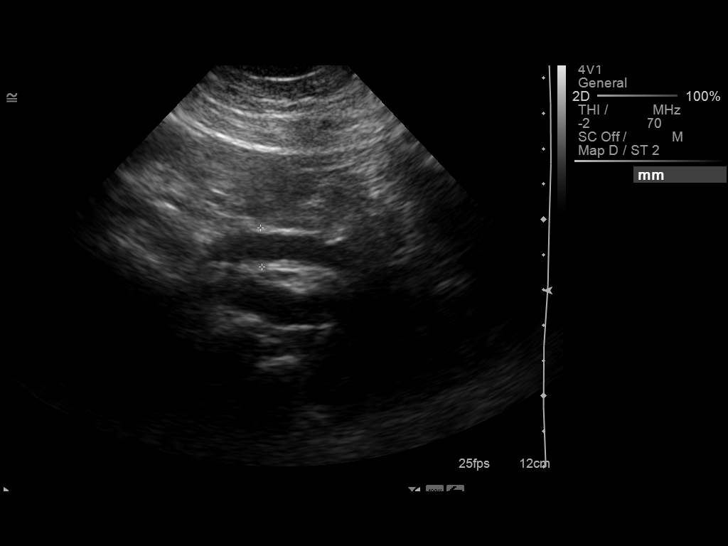
[im 14/16]
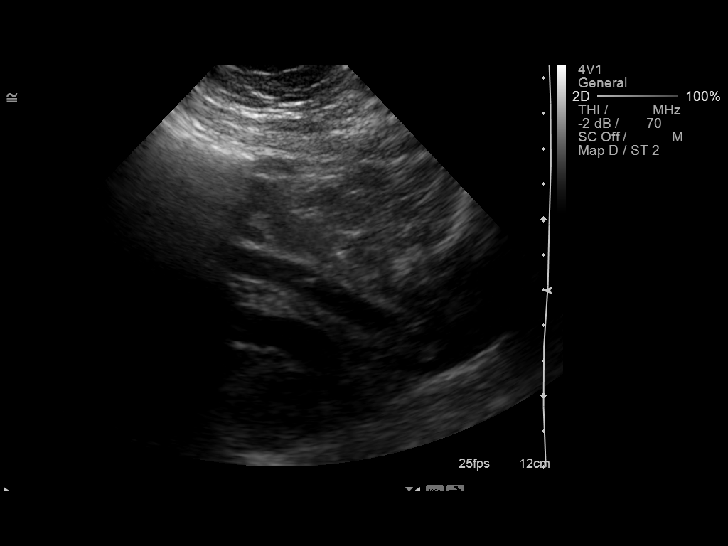
[im 15/16]
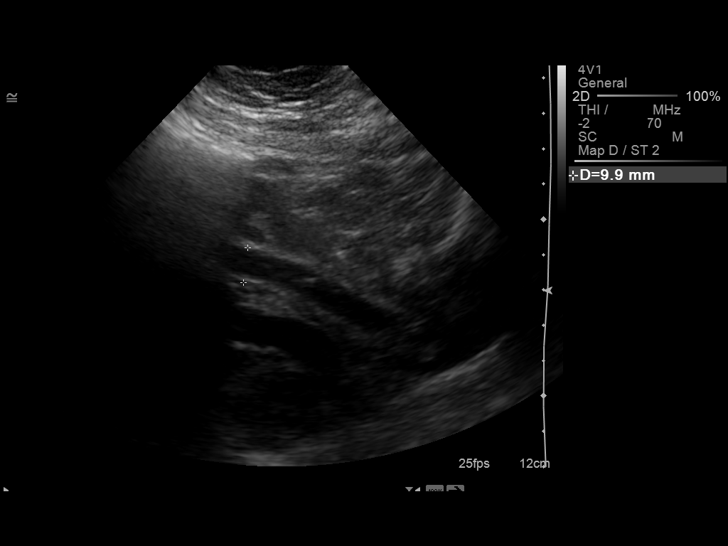
[im 16/16]
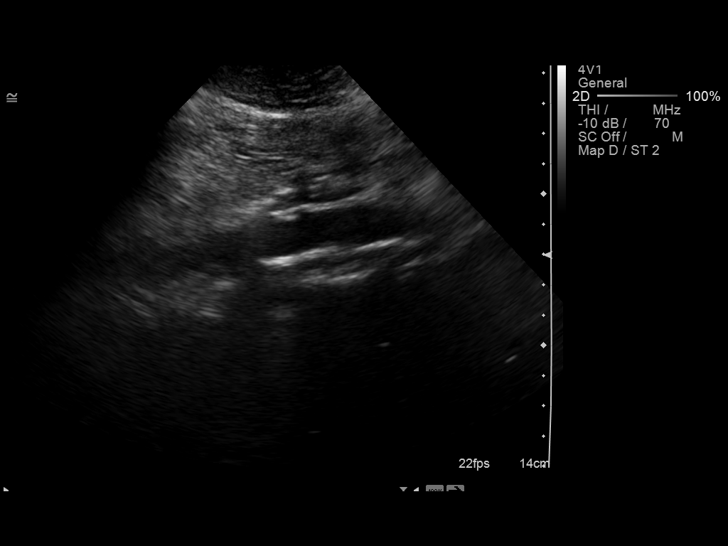

[14 of 16 positions shown; findings below may reference images not displayed]

Abdominal Aorta:  There is no evidence of abdominal aortic
aneurysm.  In maximum diameter of the abdominal aorta is 2.2 x
cm with atheromatous change present.  The mid abdominal aorta
measures only 1.8 cm in diameter.  The right common iliac artery
measures 1.0 cm in diameter proximally with the left common iliac
artery measuring 1.1 cm.

      Maximum AP diameter:  2.2 cm.
      Maximum TRV diameter:  2.1 cm.
IMPRESSION: No abdominal aortic aneurysm is seen.

## 2014-03-19 ENCOUNTER — Other Ambulatory Visit: Payer: Self-pay

## 2014-03-19 ENCOUNTER — Inpatient Hospital Stay (HOSPITAL_COMMUNITY)
Admission: EM | Admit: 2014-03-19 | Discharge: 2014-03-23 | DRG: 303 | Disposition: A | Discharge status: Home / Self Care | Payer: Medicare Other | Attending: Cardiovascular Disease | Admitting: Cardiovascular Disease

## 2014-03-19 ENCOUNTER — Encounter (HOSPITAL_COMMUNITY): Payer: Self-pay | Admitting: Emergency Medicine

## 2014-03-19 ENCOUNTER — Emergency Department (HOSPITAL_COMMUNITY): Payer: Medicare Other

## 2014-03-19 DIAGNOSIS — D500 Iron deficiency anemia secondary to blood loss (chronic): Secondary | ICD-10-CM | POA: Diagnosis present

## 2014-03-19 DIAGNOSIS — I249 Acute ischemic heart disease, unspecified: Secondary | ICD-10-CM | POA: Diagnosis present

## 2014-03-19 DIAGNOSIS — I200 Unstable angina: Secondary | ICD-10-CM | POA: Diagnosis present

## 2014-03-19 DIAGNOSIS — D509 Iron deficiency anemia, unspecified: Secondary | ICD-10-CM | POA: Diagnosis present

## 2014-03-19 DIAGNOSIS — K921 Melena: Secondary | ICD-10-CM

## 2014-03-19 DIAGNOSIS — I2510 Atherosclerotic heart disease of native coronary artery without angina pectoris: Principal | ICD-10-CM | POA: Diagnosis present

## 2014-03-19 DIAGNOSIS — Z87891 Personal history of nicotine dependence: Secondary | ICD-10-CM

## 2014-03-19 DIAGNOSIS — Z954 Presence of other heart-valve replacement: Secondary | ICD-10-CM

## 2014-03-19 DIAGNOSIS — E785 Hyperlipidemia, unspecified: Secondary | ICD-10-CM | POA: Diagnosis present

## 2014-03-19 DIAGNOSIS — R195 Other fecal abnormalities: Secondary | ICD-10-CM | POA: Diagnosis present

## 2014-03-19 DIAGNOSIS — E119 Type 2 diabetes mellitus without complications: Secondary | ICD-10-CM | POA: Diagnosis present

## 2014-03-19 DIAGNOSIS — K2970 Gastritis, unspecified, without bleeding: Secondary | ICD-10-CM | POA: Diagnosis present

## 2014-03-19 DIAGNOSIS — I10 Essential (primary) hypertension: Secondary | ICD-10-CM | POA: Diagnosis present

## 2014-03-19 DIAGNOSIS — I739 Peripheral vascular disease, unspecified: Secondary | ICD-10-CM | POA: Diagnosis present

## 2014-03-19 DIAGNOSIS — K922 Gastrointestinal hemorrhage, unspecified: Secondary | ICD-10-CM

## 2014-03-19 DIAGNOSIS — K219 Gastro-esophageal reflux disease without esophagitis: Secondary | ICD-10-CM | POA: Diagnosis present

## 2014-03-19 DIAGNOSIS — D649 Anemia, unspecified: Secondary | ICD-10-CM

## 2014-03-19 DIAGNOSIS — H409 Unspecified glaucoma: Secondary | ICD-10-CM | POA: Diagnosis present

## 2014-03-19 DIAGNOSIS — K2990 Gastroduodenitis, unspecified, without bleeding: Secondary | ICD-10-CM

## 2014-03-19 HISTORY — DX: Cardiac murmur, unspecified: R01.1

## 2014-03-19 LAB — CBC WITH DIFFERENTIAL/PLATELET
BASOS ABS: 0 10*3/uL (ref 0.0–0.1)
BASOS PCT: 0 % (ref 0–1)
EOS ABS: 0.1 10*3/uL (ref 0.0–0.7)
EOS PCT: 1 % (ref 0–5)
HEMATOCRIT: 25.2 % — AB (ref 39.0–52.0)
Hemoglobin: 8 g/dL — ABNORMAL LOW (ref 13.0–17.0)
Lymphocytes Relative: 28 % (ref 12–46)
Lymphs Abs: 2.4 10*3/uL (ref 0.7–4.0)
MCH: 22.7 pg — AB (ref 26.0–34.0)
MCHC: 31.7 g/dL (ref 30.0–36.0)
MCV: 71.4 fL — ABNORMAL LOW (ref 78.0–100.0)
MONO ABS: 0.8 10*3/uL (ref 0.1–1.0)
Monocytes Relative: 9 % (ref 3–12)
NEUTROS ABS: 5.3 10*3/uL (ref 1.7–7.7)
Neutrophils Relative %: 62 % (ref 43–77)
Platelets: 317 10*3/uL (ref 150–400)
RBC: 3.53 MIL/uL — ABNORMAL LOW (ref 4.22–5.81)
RDW: 16 % — AB (ref 11.5–15.5)
WBC: 8.6 10*3/uL (ref 4.0–10.5)

## 2014-03-19 LAB — COMPREHENSIVE METABOLIC PANEL
ALBUMIN: 3.7 g/dL (ref 3.5–5.2)
ALT: 9 U/L (ref 0–53)
AST: 14 U/L (ref 0–37)
Alkaline Phosphatase: 84 U/L (ref 39–117)
BUN: 18 mg/dL (ref 6–23)
CALCIUM: 9.4 mg/dL (ref 8.4–10.5)
CHLORIDE: 104 meq/L (ref 96–112)
CO2: 19 mEq/L (ref 19–32)
Creatinine, Ser: 1.41 mg/dL — ABNORMAL HIGH (ref 0.50–1.35)
GFR calc Af Amer: 54 mL/min — ABNORMAL LOW (ref >90)
GFR calc non Af Amer: 46 mL/min — ABNORMAL LOW (ref >90)
Glucose, Bld: 118 mg/dL — ABNORMAL HIGH (ref 70–99)
Potassium: 3.7 mEq/L (ref 3.7–5.3)
Sodium: 141 mEq/L (ref 137–147)
TOTAL PROTEIN: 7.5 g/dL (ref 6.0–8.3)
Total Bilirubin: 0.6 mg/dL (ref 0.3–1.2)

## 2014-03-19 LAB — I-STAT TROPONIN, ED: Troponin i, poc: 0.04 ng/mL (ref 0.00–0.08)

## 2014-03-20 ENCOUNTER — Encounter (HOSPITAL_COMMUNITY): Payer: Self-pay

## 2014-03-20 DIAGNOSIS — I249 Acute ischemic heart disease, unspecified: Secondary | ICD-10-CM | POA: Diagnosis present

## 2014-03-20 LAB — CBC
HEMATOCRIT: 26.8 % — AB (ref 39.0–52.0)
Hemoglobin: 8.8 g/dL — ABNORMAL LOW (ref 13.0–17.0)
MCH: 23.9 pg — ABNORMAL LOW (ref 26.0–34.0)
MCHC: 32.8 g/dL (ref 30.0–36.0)
MCV: 72.8 fL — ABNORMAL LOW (ref 78.0–100.0)
PLATELETS: 280 10*3/uL (ref 150–400)
RBC: 3.68 MIL/uL — ABNORMAL LOW (ref 4.22–5.81)
RDW: 16.9 % — ABNORMAL HIGH (ref 11.5–15.5)
WBC: 9 10*3/uL (ref 4.0–10.5)

## 2014-03-20 LAB — BASIC METABOLIC PANEL
BUN: 14 mg/dL (ref 6–23)
CHLORIDE: 106 meq/L (ref 96–112)
CO2: 23 mEq/L (ref 19–32)
Calcium: 9.4 mg/dL (ref 8.4–10.5)
Creatinine, Ser: 1.24 mg/dL (ref 0.50–1.35)
GFR calc non Af Amer: 54 mL/min — ABNORMAL LOW (ref >90)
GFR, EST AFRICAN AMERICAN: 62 mL/min — AB (ref >90)
Glucose, Bld: 102 mg/dL — ABNORMAL HIGH (ref 70–99)
Potassium: 3.8 mEq/L (ref 3.7–5.3)
Sodium: 140 mEq/L (ref 137–147)

## 2014-03-20 LAB — LIPID PANEL
Cholesterol: 125 mg/dL (ref 0–200)
HDL: 46 mg/dL (ref >39)
LDL CALC: 64 mg/dL (ref 0–99)
Total CHOL/HDL Ratio: 2.7 RATIO
Triglycerides: 76 mg/dL (ref <150)
VLDL: 15 mg/dL (ref 0–40)

## 2014-03-20 LAB — PROTIME-INR
INR: 1.01 (ref 0.00–1.49)
PROTHROMBIN TIME: 13.1 s (ref 11.6–15.2)

## 2014-03-20 LAB — TROPONIN I
Troponin I: <0.3 ng/mL (ref <0.30)
Troponin I: <0.3 ng/mL (ref <0.30)

## 2014-03-20 LAB — PREPARE RBC (CROSSMATCH)

## 2014-03-20 LAB — POC OCCULT BLOOD, ED: FECAL OCCULT BLD: POSITIVE — AB

## 2014-03-20 LAB — PRO B NATRIURETIC PEPTIDE: PRO B NATRI PEPTIDE: 533.4 pg/mL — AB (ref 0–450)

## 2014-03-20 LAB — MRSA PCR SCREENING: MRSA by PCR: NEGATIVE

## 2014-03-20 MED ORDER — SODIUM CHLORIDE 0.9 % IV SOLN
80.0000 mg | Freq: Once | INTRAVENOUS | Status: AC
  Administered 2014-03-20: 80 mg via INTRAVENOUS
  Filled 2014-03-20: qty 80

## 2014-03-20 MED ORDER — ONDANSETRON HCL 4 MG/2ML IJ SOLN: 4.0000 mg | Freq: Four times a day (QID) | INTRAMUSCULAR | Status: DC | PRN

## 2014-03-20 MED ORDER — LATANOPROST 0.005 % OP SOLN
1.0000 [drp] | Freq: Every day | OPHTHALMIC | Status: DC
  Administered 2014-03-20 – 2014-03-22 (×4): 1 [drp] via OPHTHALMIC
  Filled 2014-03-20: qty 2.5

## 2014-03-20 MED ORDER — FINASTERIDE 5 MG PO TABS
5.0000 mg | ORAL_TABLET | Freq: Every day | ORAL | Status: DC
  Administered 2014-03-20 – 2014-03-23 (×4): 5 mg via ORAL
  Filled 2014-03-20 (×4): qty 1

## 2014-03-20 MED ORDER — SODIUM CHLORIDE 0.9 % IJ SOLN
3.0000 mL | Freq: Two times a day (BID) | INTRAMUSCULAR | Status: DC
  Administered 2014-03-20 – 2014-03-23 (×7): 3 mL via INTRAVENOUS

## 2014-03-20 MED ORDER — SODIUM CHLORIDE 0.9 % IJ SOLN
3.0000 mL | INTRAMUSCULAR | Status: DC | PRN
  Administered 2014-03-20: 3 mL via INTRAVENOUS

## 2014-03-20 MED ORDER — LISINOPRIL 20 MG PO TABS
20.0000 mg | ORAL_TABLET | Freq: Two times a day (BID) | ORAL | Status: DC
  Administered 2014-03-20 – 2014-03-23 (×6): 20 mg via ORAL
  Filled 2014-03-20 (×8): qty 1

## 2014-03-20 MED ORDER — SUCRALFATE 1 GM/10ML PO SUSP
1.0000 g | Freq: Four times a day (QID) | ORAL | Status: DC
  Administered 2014-03-20 – 2014-03-23 (×14): 1 g via ORAL
  Filled 2014-03-20 (×16): qty 10

## 2014-03-20 MED ORDER — ACETAMINOPHEN 325 MG PO TABS: 650.0000 mg | ORAL_TABLET | ORAL | Status: DC | PRN

## 2014-03-20 MED ORDER — AMLODIPINE BESYLATE 2.5 MG PO TABS
2.5000 mg | ORAL_TABLET | Freq: Every day | ORAL | Status: DC
  Administered 2014-03-20 – 2014-03-23 (×4): 2.5 mg via ORAL
  Filled 2014-03-20 (×4): qty 1

## 2014-03-20 MED ORDER — NITROGLYCERIN 0.4 MG SL SUBL: 0.4000 mg | SUBLINGUAL_TABLET | SUBLINGUAL | Status: DC | PRN

## 2014-03-20 MED ORDER — QUINAPRIL HCL 10 MG PO TABS: 20.0000 mg | ORAL_TABLET | Freq: Two times a day (BID) | ORAL | Status: DC

## 2014-03-20 MED ORDER — CLONIDINE HCL 0.2 MG PO TABS
0.2000 mg | ORAL_TABLET | Freq: Every day | ORAL | Status: DC
  Administered 2014-03-20 – 2014-03-23 (×4): 0.2 mg via ORAL
  Filled 2014-03-20 (×4): qty 1

## 2014-03-20 MED ORDER — ATORVASTATIN CALCIUM 40 MG PO TABS
40.0000 mg | ORAL_TABLET | Freq: Every day | ORAL | Status: DC
  Administered 2014-03-20 – 2014-03-22 (×3): 40 mg via ORAL
  Filled 2014-03-20 (×4): qty 1

## 2014-03-20 MED ORDER — CARVEDILOL 3.125 MG PO TABS
3.1250 mg | ORAL_TABLET | Freq: Two times a day (BID) | ORAL | Status: DC
  Administered 2014-03-20 – 2014-03-23 (×6): 3.125 mg via ORAL
  Filled 2014-03-20 (×10): qty 1

## 2014-03-20 MED ORDER — MECLIZINE HCL 12.5 MG PO TABS
12.5000 mg | ORAL_TABLET | Freq: Three times a day (TID) | ORAL | Status: DC | PRN
  Filled 2014-03-20: qty 1

## 2014-03-20 MED ORDER — SODIUM CHLORIDE 0.9 % IV SOLN: 250.0000 mL | INTRAVENOUS | Status: DC | PRN

## 2014-03-20 NOTE — Progress Notes (Signed)
[facility]  2D Echocardiogram completed.  [place], RDCS 03/20/2014 3:42 PM

## 2014-03-20 NOTE — H&P (Signed)
Referring Physician:Mohan Terrence Dupont, MD  [name] is an 80 y.o. male.                       Chief Complaint: Chest pain abd shortness of breath.  HPI: 80 y.o. male with hx of CAD, HTN, Aortic stenosis s/p aortic valve replacement is here with exertional shortness of breath, progressively worse over the last several weeks. Has some chest pressure as well. Denies PND or worsening leg swelling. Has intermittent melena as well. Also noticed left lower leg numbness and cooler to touch. EKG showed infero-lateral depression.   Past Medical History  Diagnosis Date  . Heart valve disease   . Coronary artery disease   . Hypertension   . GERD (gastroesophageal reflux disease)   . Diabetes mellitus without complication   . Aortic stenosis, severe   . S/P AVR (aortic valve replacement)     on chronic coagulation  . History of colonic polyps   . GI bleed     hx of  . Shortness of breath     WITH ACTIVITY      Past Surgical History  Procedure Laterality Date  . Esophagogastroduodenoscopy  09/11/2012    Procedure: ESOPHAGOGASTRODUODENOSCOPY (EGD);  Surgeon: Beryle Beams, MD;  Location: [place] ENDOSCOPY;  Service: Endoscopy;  Laterality: N/A;  . Cardiac surgery      valve surgery  . Cholecystectomy    . Esophagogastroduodenoscopy N/A 09/17/2013    Procedure: ESOPHAGOGASTRODUODENOSCOPY (EGD);  Surgeon: Beryle Beams, MD;  Location: [facility] END[facility]COPY;  Service: Endoscopy;  Laterality: N/A;    History reviewed. No pertinent family history. Social History:  reports that he has quit smoking. He has never used smokeless tobacco. He reports that he does not drink alcohol or use illicit drugs.  Allergies: No Known Allergies   (Not in a hospital admission)  Results for orders placed during the hospital encounter of 03/19/14 (from the past 48 hour(s))  CBC WITH DIFFERENTIAL     Status: Abnormal   Collection Time    03/19/14 10:13 PM      Result Value Ref Range   WBC 8.6  4.0 - 10.5 K/uL   RBC  3.53 (*) 4.22 - 5.81 MIL/uL   Hemoglobin 8.0 (*) 13.0 - 17.0 g/dL   HCT 25.2 (*) 39.0 - 52.0 %   MCV 71.4 (*) 78.0 - 100.0 fL   MCH 22.7 (*) 26.0 - 34.0 pg   MCHC 31.7  30.0 - 36.0 g/dL   RDW 16.0 (*) 11.5 - 15.5 %   Platelets 317  150 - 400 K/uL   Neutrophils Relative % 62  43 - 77 %   Neutro Abs 5.3  1.7 - 7.7 K/uL   Lymphocytes Relative 28  12 - 46 %   Lymphs Abs 2.4  0.7 - 4.0 K/uL   Monocytes Relative 9  3 - 12 %   Monocytes Absolute 0.8  0.1 - 1.0 K/uL   Eosinophils Relative 1  0 - 5 %   Eosinophils Absolute 0.1  0.0 - 0.7 K/uL   Basophils Relative 0  0 - 1 %   Basophils Absolute 0.0  0.0 - 0.1 K/uL  COMPREHENSIVE METABOLIC PANEL     Status: Abnormal   Collection Time    03/19/14 10:13 PM      Result Value Ref Range   Sodium 141  137 - 147 mEq/L   Potassium 3.7  3.7 - 5.3 mEq/L   Chloride 104  96 -  112 mEq/L   CO2 19  19 - 32 mEq/L   Glucose, Bld 118 (*) 70 - 99 mg/dL   BUN 18  6 - 23 mg/dL   Creatinine, Ser 1.41 (*) 0.50 - 1.35 mg/dL   Calcium 9.4  8.4 - 10.5 mg/dL   Total Protein 7.5  6.0 - 8.3 g/dL   Albumin 3.7  3.5 - 5.2 g/dL   AST 14  0 - 37 U/L   ALT 9  0 - 53 U/L   Alkaline Phosphatase 84  39 - 117 U/L   Total Bilirubin 0.6  0.3 - 1.2 mg/dL   GFR calc non Af Amer 46 (*) >90 mL/min   GFR calc Af Amer 54 (*) >90 mL/min   Comment: (NOTE)     The eGFR has been calculated using the CKD EPI equation.     This calculation has not been validated in all clinical situations.     eGFR's persistently <90 mL/min signify possible Chronic Kidney     Disease.  Randolm Idol, ED     Status: None   Collection Time    03/19/14 10:22 PM      Result Value Ref Range   Troponin i, poc 0.04  0.00 - 0.08 ng/mL   Comment 3            Comment: Due to the release kinetics of cTnI,     a negative result within the first hours     of the onset of symptoms does not rule out     myocardial infarction with certainty.     If myocardial infarction is still suspected,     repeat  the test at appropriate intervals.  PRO B NATRIURETIC PEPTIDE     Status: Abnormal   Collection Time    03/19/14 11:31 PM      Result Value Ref Range   Pro B Natriuretic peptide (BNP) 533.4 (*) 0 - 450 pg/mL  POC OCCULT BLOOD, ED     Status: Abnormal   Collection Time    03/20/14 12:21 AM      Result Value Ref Range   Fecal Occult Bld POSITIVE (*) NEGATIVE  TYPE AND SCREEN     Status: None   Collection Time    03/20/14  1:15 AM      Result Value Ref Range   ABO/RH(D) A POS     Antibody Screen NEG     Sample Expiration 03/23/2014     Unit Number [id number]     Blood Component Type RED CELLS,LR     Unit division 00     Status of Unit ALLOCATED     Transfusion Status OK TO TRANSFUSE     Crossmatch Result Compatible    PREPARE RBC (CROSSMATCH)     Status: None   Collection Time    03/20/14  1:30 AM      Result Value Ref Range   Order Confirmation ORDER PROCESSED BY BLOOD BANK     Dg Chest 2 View  03/20/2014   CLINICAL DATA:  Chest pain.  Pain with exertion.  EXAM: CHEST  2 VIEW  COMPARISON:  09/13/2013.  FINDINGS: Aortic valve replacement. Median sternotomy. Lungs appear clear. Monitoring leads project over the chest. No airspace disease or pleural effusion. Mid thoracic compression fractures and mildly exaggerated kyphosis, chronic.  Cholecystectomy clips are present in the right upper quadrant.  IMPRESSION: No acute cardiopulmonary disease.  Aortic valve replacement.   Electronically Signed   By: Cay Schillings  Lamke M.D.   On: 03/20/2014 00:30     Review of Systems  Constitutional: Positive for diaphoresis. Negative for fever, chills and activity change.  Eyes: Negative for visual disturbance.  Respiratory: Positive for shortness of breath. Negative for cough and chest tightness.  Cardiovascular: Positive for chest pain.  Gastrointestinal: Negative for abdominal distention.  Genitourinary: Negative for dysuria, enuresis and difficulty urinating.  Musculoskeletal: Negative for  arthralgias and neck pain.  Neurological: Positive for dizziness. Negative for light-headedness and headaches.  Psychiatric/Behavioral: Negative for confusion   Blood pressure 160/59, pulse 71, temperature 98.1 F (36.7 C), temperature source Oral, resp. rate 16, height $RemoveBe'5\' 11"'AryasivGF$  (1.803 m), weight 86.637 kg (191 lb), SpO2 100.00%.  Physical Exam  Nursing note and vitals reviewed.  Constitutional: Averagely built and poorly nourished.  HENT: Normocephalic. Mouth/Throat: Oropharynx is clear and moist.  Eyes: [employer] Shark, wears glasses, EOM are normal. Pupils are equal, round, and reactive to light. Pale Conjunctivae.   Neck: Normal range of motion. Neck supple.  Cardiovascular: Normal rate, regular rhythm and normal heart sounds. IV/VI Systolic murmur.  Pulmonary/Chest: Effort normal and breath sounds normal. No respiratory distress. He has no wheezes. He has no rales.  Abdominal: Soft. Bowel sounds are normal. He exhibits no distension. There is no tenderness. There is no guarding.  Musculoskeletal: Normal range of motion. No edema.  Neurological: He is alert. No cranial nerve deficit. Coordination normal.  Skin: Skin is warm and dry.  Psychiatric: He has a normal mood and affect. His behavior is normal. Judgment and thought content normal.   Assessment/Plan Acute coronary syndrome CAD Acute upper GI bleed Anemia of blood loss and iron deficiency anemia H/O Antral AV malformation H/O Aortic stenosis S/P AV replacement Hypertension Peripheral vascular disease DM, II Dyslipidemia Glaucoma  Admit/ Agree with blood transfusion. Hold cardiac interventions for now. Continue home medications. Hold aspirin.   KADAKIA,AJAY S 03/20/2014, 2:29 AM

## 2014-03-20 NOTE — ED Provider Notes (Signed)
CSN: [id number]     Arrival date & time 03/19/14  2158 History   First MD Initiated Contact with Patient 03/19/14 2321     Chief Complaint  Patient presents with  . Chest Pain     (Consider location/radiation/quality/duration/timing/severity/associated sxs/prior Treatment) The history is provided by the patient.  [name] is a 80 y.o. male hx of CAD, HTN, Aortic stenosis s/p aortic valve replacement here with SOB. Progressively worse shortness of breath on exertion over the last several weeks. Has some chest pressure as well. Denies PND or worsening leg swelling. Has intermittent melena as well.    Past Medical History  Diagnosis Date  . Heart valve disease   . Coronary artery disease   . Hypertension   . GERD (gastroesophageal reflux disease)   . Diabetes mellitus without complication   . Aortic stenosis, severe   . S/P AVR (aortic valve replacement)     on chronic coagulation  . History of colonic polyps   . GI bleed     hx of  . Shortness of breath     WITH ACTIVITY   Past Surgical History  Procedure Laterality Date  . Esophagogastroduodenoscopy  09/11/2012    Procedure: ESOPHAGOGASTRODUODENOSCOPY (EGD);  Surgeon: Beryle Beams, MD;  Location: [facility] ENDOSCOPY;  Service: Endoscopy;  Laterality: N/A;  . Cardiac surgery      valve surgery  . Cholecystectomy    . Esophagogastroduodenoscopy N/A 09/17/2013    Procedure: ESOPHAGOGASTRODUODENOSCOPY (EGD);  Surgeon: Beryle Beams, MD;  Location: [facility] ENDOSCOPY;  Service: Endoscopy;  Laterality: N/A;   History reviewed. No pertinent family history. History  Substance Use Topics  . Smoking status: Former Research scientist (life sciences)  . Smokeless tobacco: Never Used     Comment: QUIT SMOKING BACK IN THE 90"S  . Alcohol Use: No    Review of Systems  Respiratory: Positive for shortness of breath.   Cardiovascular: Positive for chest pain.  All other systems reviewed and are negative.     Allergies  Review of patient's allergies indicates  no known allergies.  Home Medications   Prior to Admission medications   Medication Sig Start Date End Date Taking? Authorizing Sidrah Harden  amLODipine (NORVASC) 2.5 MG tablet Take 2.5 mg by mouth daily.    Yes Historical Melayah Skorupski, MD  atorvastatin (LIPITOR) 40 MG tablet Take 1 tablet by mouth daily. 08/05/13  Yes Historical Mayrene Bastarache, MD  bimatoprost (LUMIGAN) 0.01 % SOLN Place 1 drop into both eyes at bedtime.   Yes Historical Elinda Bunten, MD  cloNIDine (CATAPRES) 0.2 MG tablet Take 0.2 mg by mouth daily.   Yes Historical Gerell Fortson, MD  finasteride (PROSCAR) 5 MG tablet Take 5 mg by mouth daily.   Yes Historical Theresia Pree, MD  meclizine (ANTIVERT) 12.5 MG tablet Take 1 tablet (12.5 mg total) by mouth 3 (three) times daily as needed for dizziness. 11/30/12  Yes Kalman Drape, MD  quinapril (ACCUPRIL) 20 MG tablet Take 20 mg by mouth 2 (two) times daily.   Yes Historical Jamas Jaquay, MD  sucralfate (CARAFATE) 1 GM/10ML suspension Take 1 g by mouth 4 (four) times daily.   Yes Historical Keil Pickering, MD   BP 142/60  Pulse 90  Temp(Src) 98.1 F (36.7 C) (Oral)  Resp 16  Ht 5\' 11"  (1.803 m)  Wt 191 lb (86.637 kg)  BMI 26.65 kg/m2  SpO2 100% Physical Exam  Nursing note and vitals reviewed. Constitutional:  Chronically ill   HENT:  Head: Normocephalic.  Mouth/Throat: Oropharynx is clear and moist.  Eyes:  EOM are normal. Pupils are equal, round, and reactive to light.  Conjunctiva slightly pale   Neck: Normal range of motion. Neck supple.  Cardiovascular: Normal rate, regular rhythm and normal heart sounds.   Systolic murmur with mechanical clicks (hx of aortic valve replacement)   Pulmonary/Chest: Effort normal and breath sounds normal. No respiratory distress. He has no wheezes. He has no rales.  Abdominal: Soft. Bowel sounds are normal. He exhibits no distension. There is no tenderness. There is no guarding.  Musculoskeletal: Normal range of motion.  1+ edema bilaterally   Neurological: He is  alert. No cranial nerve deficit. Coordination normal.  Skin: Skin is warm and dry.  Psychiatric: He has a normal mood and affect. His behavior is normal. Judgment and thought content normal.    ED Course  Procedures (including critical care time)  CRITICAL CARE Performed by: Darl Householder, DAVID   Total critical care time: 30 min   Critical care time was exclusive of separately billable procedures and treating other patients.  Critical care was necessary to treat or prevent imminent or life-threatening deterioration.  Critical care was time spent personally by me on the following activities: development of treatment plan with patient and/or surrogate as well as nursing, discussions with consultants, evaluation of patient's response to treatment, examination of patient, obtaining history from patient or surrogate, ordering and performing treatments and interventions, ordering and review of laboratory studies, ordering and review of radiographic studies, pulse oximetry and re-evaluation of patient's condition.   Labs Review Labs Reviewed  CBC WITH DIFFERENTIAL - Abnormal; Notable for the following:    RBC 3.53 (*)    Hemoglobin 8.0 (*)    HCT 25.2 (*)    MCV 71.4 (*)    MCH 22.7 (*)    RDW 16.0 (*)    All other components within normal limits  COMPREHENSIVE METABOLIC PANEL - Abnormal; Notable for the following:    Glucose, Bld 118 (*)    Creatinine, Ser 1.41 (*)    GFR calc non Af Amer 46 (*)    GFR calc Af Amer 54 (*)    All other components within normal limits  PRO B NATRIURETIC PEPTIDE - Abnormal; Notable for the following:    Pro B Natriuretic peptide (BNP) 533.4 (*)    All other components within normal limits  POC OCCULT BLOOD, ED - Abnormal; Notable for the following:    Fecal Occult Bld POSITIVE (*)    All other components within normal limits  I-STAT TROPOININ, ED    Imaging Review Dg Chest 2 View  03/20/2014   CLINICAL DATA:  Chest pain.  Pain with exertion.  EXAM:  CHEST  2 VIEW  COMPARISON:  09/13/2013.  FINDINGS: Aortic valve replacement. Median sternotomy. Lungs appear clear. Monitoring leads project over the chest. No airspace disease or pleural effusion. Mid thoracic compression fractures and mildly exaggerated kyphosis, chronic.  Cholecystectomy clips are present in the right upper quadrant.  IMPRESSION: No acute cardiopulmonary disease.  Aortic valve replacement.   Electronically Signed   By: Dereck Ligas M.D.   On: 03/20/2014 00:30     EKG Interpretation None      Date: 03/20/2014  Rate: 108  Rhythm: sinus tachycardia  QRS Axis: normal  Intervals: normal  ST/T Wave abnormalities: TWI and ST depression inferior and lateral  Conduction Disutrbances:none  Narrative Interpretation:   Old EKG Reviewed: changes noted    MDM   Final diagnoses:  None    [name] is a 80  y.o. male here with SOB, chest pain with exertion. Concerned for unstable angina vs symptomatic anemia. EKG showed inferior depression. Check labs, trop, cxr. Will likely need admission.   12:57 AM Hg 8, dec from baseline. Occ positive. Given protonix. Transfused with PRBC. Will admit.     Wandra Arthurs, MD 03/20/14 [phone]

## 2014-03-20 NOTE — Consult Note (Signed)
Reason for Consult: Anemia, Melena, and Heme positive stool Referring Physician: Dixie Dials, M.D.  [name] HPI: This is a 80 year old male who is well-known to me for his history of GI bleeds.  He was admitted for chest pain and there was evidence of ST depressions in the inferior leads.  The patient also complained of melena, but his HGB did not drop significantly.  On 09/2013 he underwent an EGD for a similar presentation and he was identified to have several small oozing proximal gastric AVMs, which were ablated with APC.  He has been relatively stable since that time and currently his HGB is higher than it was in December.  He denies seeing any melena at home.  As a result of his symptoms a GI consultation was requested.  Past Medical History  Diagnosis Date  . Heart valve disease   . Coronary artery disease   . Hypertension   . GERD (gastroesophageal reflux disease)   . Diabetes mellitus without complication   . Aortic stenosis, severe   . S/P AVR (aortic valve replacement)     on chronic coagulation  . History of colonic polyps   . GI bleed     hx of  . Shortness of breath     WITH ACTIVITY  . Heart murmur     Past Surgical History  Procedure Laterality Date  . Esophagogastroduodenoscopy  09/11/2012    Procedure: ESOPHAGOGASTRODUODENOSCOPY (EGD);  Surgeon: Beryle Beams, MD;  Location: [facility] ENDOSCOPY;  Service: Endoscopy;  Laterality: N/A;  . Cardiac surgery      valve surgery  . Cholecystectomy    . Esophagogastroduodenoscopy N/A 09/17/2013    Procedure: ESOPHAGOGASTRODUODENOSCOPY (EGD);  Surgeon: Beryle Beams, MD;  Location: [facility] ENDOSCOPY;  Service: Endoscopy;  Laterality: N/A;    History reviewed. No pertinent family history.  Social History:  reports that he has quit smoking. He has never used smokeless tobacco. He reports that he does not drink alcohol or use illicit drugs.  Allergies: No Known Allergies  Medications:  Scheduled: . amLODipine  2.5 mg Oral  Daily  . atorvastatin  40 mg Oral Daily  . carvedilol  3.125 mg Oral BID WC  . cloNIDine  0.2 mg Oral Daily  . finasteride  5 mg Oral Daily  . latanoprost  1 drop Both Eyes QHS  . lisinopril  20 mg Oral BID  . sodium chloride  3 mL Intravenous Q12H  . sucralfate  1 g Oral QID   Continuous:   Results for orders placed during the hospital encounter of 03/19/14 (from the past 24 hour(s))  CBC WITH DIFFERENTIAL     Status: Abnormal   Collection Time    03/19/14 10:13 PM      Result Value Ref Range   WBC 8.6  4.0 - 10.5 K/uL   RBC 3.53 (*) 4.22 - 5.81 MIL/uL   Hemoglobin 8.0 (*) 13.0 - 17.0 g/dL   HCT 25.2 (*) 39.0 - 52.0 %   MCV 71.4 (*) 78.0 - 100.0 fL   MCH 22.7 (*) 26.0 - 34.0 pg   MCHC 31.7  30.0 - 36.0 g/dL   RDW 16.0 (*) 11.5 - 15.5 %   Platelets 317  150 - 400 K/uL   Neutrophils Relative % 62  43 - 77 %   Neutro Abs 5.3  1.7 - 7.7 K/uL   Lymphocytes Relative 28  12 - 46 %   Lymphs Abs 2.4  0.7 - 4.0 K/uL  Monocytes Relative 9  3 - 12 %   Monocytes Absolute 0.8  0.1 - 1.0 K/uL   Eosinophils Relative 1  0 - 5 %   Eosinophils Absolute 0.1  0.0 - 0.7 K/uL   Basophils Relative 0  0 - 1 %   Basophils Absolute 0.0  0.0 - 0.1 K/uL  COMPREHENSIVE METABOLIC PANEL     Status: Abnormal   Collection Time    03/19/14 10:13 PM      Result Value Ref Range   Sodium 141  137 - 147 mEq/L   Potassium 3.7  3.7 - 5.3 mEq/L   Chloride 104  96 - 112 mEq/L   CO2 19  19 - 32 mEq/L   Glucose, Bld 118 (*) 70 - 99 mg/dL   BUN 18  6 - 23 mg/dL   Creatinine, Ser 1.41 (*) 0.50 - 1.35 mg/dL   Calcium 9.4  8.4 - 10.5 mg/dL   Total Protein 7.5  6.0 - 8.3 g/dL   Albumin 3.7  3.5 - 5.2 g/dL   AST 14  0 - 37 U/L   ALT 9  0 - 53 U/L   Alkaline Phosphatase 84  39 - 117 U/L   Total Bilirubin 0.6  0.3 - 1.2 mg/dL   GFR calc non Af Amer 46 (*) >90 mL/min   GFR calc Af Amer 54 (*) >90 mL/min  I-STAT TROPOININ, ED     Status: None   Collection Time    03/19/14 10:22 PM      Result Value Ref Range    Troponin i, poc 0.04  0.00 - 0.08 ng/mL   Comment 3           PRO B NATRIURETIC PEPTIDE     Status: Abnormal   Collection Time    03/19/14 11:31 PM      Result Value Ref Range   Pro B Natriuretic peptide (BNP) 533.4 (*) 0 - 450 pg/mL  POC OCCULT BLOOD, ED     Status: Abnormal   Collection Time    03/20/14 12:21 AM      Result Value Ref Range   Fecal Occult Bld POSITIVE (*) NEGATIVE  TYPE AND SCREEN     Status: None   Collection Time    03/20/14  1:11 AM      Result Value Ref Range   ABO/RH(D) A POS     Antibody Screen NEG     Sample Expiration 03/23/2014     Unit Number [id number]     Blood Component Type RED CELLS,LR     Unit division 00     Status of Unit ISSUED     Transfusion Status OK TO TRANSFUSE     Crossmatch Result Compatible    PREPARE RBC (CROSSMATCH)     Status: None   Collection Time    03/20/14  1:30 AM      Result Value Ref Range   Order Confirmation ORDER PROCESSED BY BLOOD BANK    MRSA PCR SCREENING     Status: None   Collection Time    03/20/14  4:10 AM      Result Value Ref Range   MRSA by PCR NEGATIVE  NEGATIVE  TROPONIN I     Status: None   Collection Time    03/20/14  8:00 AM      Result Value Ref Range   Troponin I <0.30  <0.30 ng/mL  LIPID PANEL     Status: None   Collection  Time    03/20/14  8:00 AM      Result Value Ref Range   Cholesterol 125  0 - 200 mg/dL   Triglycerides 76  <150 mg/dL   HDL 46  >39 mg/dL   Total CHOL/HDL Ratio 2.7     VLDL 15  0 - 40 mg/dL   LDL Cholesterol 64  0 - 99 mg/dL  CBC     Status: Abnormal   Collection Time    03/20/14  8:00 AM      Result Value Ref Range   WBC 9.0  4.0 - 10.5 K/uL   RBC 3.68 (*) 4.22 - 5.81 MIL/uL   Hemoglobin 8.8 (*) 13.0 - 17.0 g/dL   HCT 26.8 (*) 39.0 - 52.0 %   MCV 72.8 (*) 78.0 - 100.0 fL   MCH 23.9 (*) 26.0 - 34.0 pg   MCHC 32.8  30.0 - 36.0 g/dL   RDW 16.9 (*) 11.5 - 15.5 %   Platelets 280  150 - 400 K/uL  BASIC METABOLIC PANEL     Status: Abnormal   Collection Time     03/20/14  8:00 AM      Result Value Ref Range   Sodium 140  137 - 147 mEq/L   Potassium 3.8  3.7 - 5.3 mEq/L   Chloride 106  96 - 112 mEq/L   CO2 23  19 - 32 mEq/L   Glucose, Bld 102 (*) 70 - 99 mg/dL   BUN 14  6 - 23 mg/dL   Creatinine, Ser 1.24  0.50 - 1.35 mg/dL   Calcium 9.4  8.4 - 10.5 mg/dL   GFR calc non Af Amer 54 (*) >90 mL/min   GFR calc Af Amer 62 (*) >90 mL/min  PROTIME-INR     Status: None   Collection Time    03/20/14  8:00 AM      Result Value Ref Range   Prothrombin Time 13.1  11.6 - 15.2 seconds   INR 1.01  0.00 - 1.49  TROPONIN I     Status: None   Collection Time    03/20/14  1:30 PM      Result Value Ref Range   Troponin I <0.30  <0.30 ng/mL     Dg Chest 2 View  03/20/2014   CLINICAL DATA:  Chest pain.  Pain with exertion.  EXAM: CHEST  2 VIEW  COMPARISON:  09/13/2013.  FINDINGS: Aortic valve replacement. Median sternotomy. Lungs appear clear. Monitoring leads project over the chest. No airspace disease or pleural effusion. Mid thoracic compression fractures and mildly exaggerated kyphosis, chronic.  Cholecystectomy clips are present in the right upper quadrant.  IMPRESSION: No acute cardiopulmonary disease.  Aortic valve replacement.   Electronically Signed   By: Dereck Ligas M.D.   On: 03/20/2014 00:30    ROS:  As stated above in the HPI otherwise negative.  Blood pressure 115/67, pulse 62, temperature 98 F (36.7 C), temperature source Oral, resp. rate 12, height 5\' 11"  (1.803 m), weight 176 lb 2.4 oz (79.9 kg), SpO2 100.00%.    PE: Gen: NAD, Alert and Oriented HEENT:  [place]/AT, EOMI Neck: Supple, no LAD Lungs: CTA Bilaterally CV: RRR without M/G/R ABM: Soft, NTND, +BS Ext: No C/C/E  Assessment/Plan: 1) Melena. 2) Anemia. 3) Heme positive stool. 4) Chest pain. 5) S/p AVR on coumadin.   His HGB is not as low as it was in December and the rectal examination revealed brown stool.  I will finalize  my decision to perform an EGD tomorrow pending  his AM CBC.  If he drops, then I will have him undergo the EGD.  Plan: 1) Await CBC in the AM.  HUNG,PATRICK D 03/20/2014, 3:10 PM

## 2014-03-20 NOTE — Progress Notes (Signed)
Ref: Clent Demark, MD   Subjective:  Little improved. Echocardiogram revealed severe AS. Appreciate GI consult.  Objective:  Vital Signs in the last 24 hours: Temp:  [97.5 F (36.4 C)-98.3 F (36.8 C)] 98.1 F (36.7 C) (06/18 1928) Pulse Rate:  [59-133] 59 (06/18 1928) Cardiac Rhythm:  [-]  Resp:  [12-22] 19 (06/18 1928) BP: (101-183)/(42-85) 114/47 mmHg (06/18 1928) SpO2:  [96 %-100 %] 100 % (06/18 1928) Weight:  [79.9 kg (176 lb 2.4 oz)-86.637 kg (191 lb)] 79.9 kg (176 lb 2.4 oz) (06/18 0420)  Physical Exam: BP Readings from Last 1 Encounters:  03/20/14 114/47    Wt Readings from Last 1 Encounters:  03/20/14 79.9 kg (176 lb 2.4 oz)    Weight change:   HEENT: [place]/AT, Eyes-Brown, PERL, EOMI, Conjunctiva-Pale, Sclera-Non-icteric Neck: No JVD, No bruit, Trachea midline. Lungs:  Clear, Bilateral. Cardiac:  Regular rhythm, normal S1 and S2, no S3. III/VI systolic murmur. Abdomen:  Soft, non-tender. Extremities:  No edema present. No cyanosis. No clubbing. CNS: AxOx3, Cranial nerves grossly intact, moves all 4 extremities. Right handed. Skin: Warm and dry.   Intake/Output from previous day: 06/17 0701 - 06/18 0700 In: 120 [P.O.:120] Out: 350 [Urine:350]    Lab Results: BMET    Component Value Date/Time   NA 140 03/20/2014 0800   NA 141 03/19/2014 2213   NA 133* 09/19/2013 0535   K 3.8 03/20/2014 0800   K 3.7 03/19/2014 2213   K 4.0 09/19/2013 0535   CL 106 03/20/2014 0800   CL 104 03/19/2014 2213   CL 101 09/19/2013 0535   CO2 23 03/20/2014 0800   CO2 19 03/19/2014 2213   CO2 23 09/19/2013 0535   GLUCOSE 102* 03/20/2014 0800   GLUCOSE 118* 03/19/2014 2213   GLUCOSE 99 09/19/2013 0535   BUN 14 03/20/2014 0800   BUN 18 03/19/2014 2213   BUN 10 09/19/2013 0535   CREATININE 1.24 03/20/2014 0800   CREATININE 1.41* 03/19/2014 2213   CREATININE 1.34 09/19/2013 0535   CALCIUM 9.4 03/20/2014 0800   CALCIUM 9.4 03/19/2014 2213   CALCIUM 8.8 09/19/2013 0535   GFRNONAA 54*  03/20/2014 0800   GFRNONAA 46* 03/19/2014 2213   GFRNONAA 49* 09/19/2013 0535   GFRAA 62* 03/20/2014 0800   GFRAA 54* 03/19/2014 2213   GFRAA 57* 09/19/2013 0535   CBC    Component Value Date/Time   WBC 9.0 03/20/2014 0800   RBC 3.68* 03/20/2014 0800   HGB 8.8* 03/20/2014 0800   HCT 26.8* 03/20/2014 0800   PLT 280 03/20/2014 0800   MCV 72.8* 03/20/2014 0800   MCH 23.9* 03/20/2014 0800   MCHC 32.8 03/20/2014 0800   RDW 16.9* 03/20/2014 0800   LYMPHSABS 2.4 03/19/2014 2213   MONOABS 0.8 03/19/2014 2213   EOSABS 0.1 03/19/2014 2213   BASOSABS 0.0 03/19/2014 2213   HEPATIC Function Panel  Recent Labs  09/13/13 1320 09/16/13 0800 03/19/14 2213  PROT 6.1 6.6 7.5   HEMOGLOBIN A1C No components found with this basename: HGA1C,  MPG   CARDIAC ENZYMES Lab Results  Component Value Date   CKTOTAL 70 11/24/2010   CKMB 1.5 11/24/2010   TROPONINI <0.30 03/20/2014   TROPONINI <0.30 03/20/2014   TROPONINI <0.30 09/13/2013   BNP  Recent Labs  09/13/13 0640 03/19/14 2331  PROBNP 137.2 533.4*   TSH  Recent Labs  09/13/13 1320  TSH 0.641   CHOLESTEROL  Recent Labs  03/20/14 0800  CHOL 125    Scheduled Meds: .  amLODipine  2.5 mg Oral Daily  . atorvastatin  40 mg Oral Daily  . carvedilol  3.125 mg Oral BID WC  . cloNIDine  0.2 mg Oral Daily  . finasteride  5 mg Oral Daily  . latanoprost  1 drop Both Eyes QHS  . lisinopril  20 mg Oral BID  . sodium chloride  3 mL Intravenous Q12H  . sucralfate  1 g Oral QID   Continuous Infusions:  PRN Meds:.sodium chloride, acetaminophen, meclizine, nitroGLYCERIN, ondansetron (ZOFRAN) IV, sodium chloride  Assessment/Plan: Acute coronary syndrome  CAD  Acute upper GI bleed  Anemia of blood loss and iron deficiency anemia  H/O Antral AV malformation  H/O Aortic stenosis  S/P Prosthetic AV replacement  Hypertension  Peripheral vascular disease  DM, II  Dyslipidemia  Glaucoma  Continue medical treatment.   LOS: 1 day    Dixie Dials  MD  03/20/2014, 8:08 PM

## 2014-03-20 NOTE — ED Notes (Signed)
Attempted report 

## 2014-03-20 NOTE — Progress Notes (Signed)
Utilization review completed. Bertha Stanfill, RN, BSN. 

## 2014-03-21 ENCOUNTER — Encounter (HOSPITAL_COMMUNITY): Payer: Self-pay | Admitting: *Deleted

## 2014-03-21 ENCOUNTER — Encounter (HOSPITAL_COMMUNITY)
Admission: EM | Disposition: A | Discharge status: Home / Self Care | Payer: Self-pay | Source: Home / Self Care | Attending: Cardiovascular Disease

## 2014-03-21 HISTORY — PX: ESOPHAGOGASTRODUODENOSCOPY: SHX5428

## 2014-03-21 LAB — TYPE AND SCREEN
ABO/RH(D): A POS
ANTIBODY SCREEN: NEGATIVE
Unit division: 0

## 2014-03-21 LAB — CBC
HCT: 24.1 % — ABNORMAL LOW (ref 39.0–52.0)
Hemoglobin: 7.9 g/dL — ABNORMAL LOW (ref 13.0–17.0)
MCH: 24 pg — ABNORMAL LOW (ref 26.0–34.0)
MCHC: 32.8 g/dL (ref 30.0–36.0)
MCV: 73.3 fL — ABNORMAL LOW (ref 78.0–100.0)
PLATELETS: 258 10*3/uL (ref 150–400)
RBC: 3.29 MIL/uL — AB (ref 4.22–5.81)
RDW: 16.7 % — AB (ref 11.5–15.5)
WBC: 7.7 10*3/uL (ref 4.0–10.5)

## 2014-03-21 SURGERY — EGD (ESOPHAGOGASTRODUODENOSCOPY)
Anesthesia: Moderate Sedation

## 2014-03-21 MED ORDER — FENTANYL CITRATE 0.05 MG/ML IJ SOLN
INTRAMUSCULAR | Status: AC
  Filled 2014-03-21: qty 2

## 2014-03-21 MED ORDER — MIDAZOLAM HCL 5 MG/ML IJ SOLN
INTRAMUSCULAR | Status: AC
  Filled 2014-03-21: qty 2

## 2014-03-21 MED ORDER — FENTANYL CITRATE 0.05 MG/ML IJ SOLN
INTRAMUSCULAR | Status: DC | PRN
  Administered 2014-03-21: 25 ug via INTRAVENOUS

## 2014-03-21 MED ORDER — SODIUM CHLORIDE 0.9 % IV SOLN
INTRAVENOUS | Status: DC
Start: 2014-03-21 — End: 2014-03-21
  Administered 2014-03-21: 08:00:00 via INTRAVENOUS

## 2014-03-21 MED ORDER — MIDAZOLAM HCL 10 MG/2ML IJ SOLN
INTRAMUSCULAR | Status: DC | PRN
  Administered 2014-03-21: 2 mg via INTRAVENOUS

## 2014-03-21 NOTE — H&P (View-Only) (Signed)
Ref: Clent Demark, MD   Subjective:  Little improved. Echocardiogram revealed severe AS. Appreciate GI consult.  Objective:  Vital Signs in the last 24 hours: Temp:  [97.5 F (36.4 C)-98.3 F (36.8 C)] 98.1 F (36.7 C) (06/18 1928) Pulse Rate:  [59-133] 59 (06/18 1928) Cardiac Rhythm:  [-]  Resp:  [12-22] 19 (06/18 1928) BP: (101-183)/(42-85) 114/47 mmHg (06/18 1928) SpO2:  [96 %-100 %] 100 % (06/18 1928) Weight:  [79.9 kg (176 lb 2.4 oz)-86.637 kg (191 lb)] 79.9 kg (176 lb 2.4 oz) (06/18 0420)  Physical Exam: BP Readings from Last 1 Encounters:  03/20/14 114/47    Wt Readings from Last 1 Encounters:  03/20/14 79.9 kg (176 lb 2.4 oz)    Weight change:   HEENT: [place]/AT, Eyes-Brown, PERL, EOMI, Conjunctiva-Pale, Sclera-Non-icteric Neck: No JVD, No bruit, Trachea midline. Lungs:  Clear, Bilateral. Cardiac:  Regular rhythm, normal S1 and S2, no S3. III/VI systolic murmur. Abdomen:  Soft, non-tender. Extremities:  No edema present. No cyanosis. No clubbing. CNS: AxOx3, Cranial nerves grossly intact, moves all 4 extremities. Right handed. Skin: Warm and dry.   Intake/Output from previous day: 06/17 0701 - 06/18 0700 In: 120 [P.O.:120] Out: 350 [Urine:350]    Lab Results: BMET    Component Value Date/Time   NA 140 03/20/2014 0800   NA 141 03/19/2014 2213   NA 133* 09/19/2013 0535   K 3.8 03/20/2014 0800   K 3.7 03/19/2014 2213   K 4.0 09/19/2013 0535   CL 106 03/20/2014 0800   CL 104 03/19/2014 2213   CL 101 09/19/2013 0535   CO2 23 03/20/2014 0800   CO2 19 03/19/2014 2213   CO2 23 09/19/2013 0535   GLUCOSE 102* 03/20/2014 0800   GLUCOSE 118* 03/19/2014 2213   GLUCOSE 99 09/19/2013 0535   BUN 14 03/20/2014 0800   BUN 18 03/19/2014 2213   BUN 10 09/19/2013 0535   CREATININE 1.24 03/20/2014 0800   CREATININE 1.41* 03/19/2014 2213   CREATININE 1.34 09/19/2013 0535   CALCIUM 9.4 03/20/2014 0800   CALCIUM 9.4 03/19/2014 2213   CALCIUM 8.8 09/19/2013 0535   GFRNONAA 54*  03/20/2014 0800   GFRNONAA 46* 03/19/2014 2213   GFRNONAA 49* 09/19/2013 0535   GFRAA 62* 03/20/2014 0800   GFRAA 54* 03/19/2014 2213   GFRAA 57* 09/19/2013 0535   CBC    Component Value Date/Time   WBC 9.0 03/20/2014 0800   RBC 3.68* 03/20/2014 0800   HGB 8.8* 03/20/2014 0800   HCT 26.8* 03/20/2014 0800   PLT 280 03/20/2014 0800   MCV 72.8* 03/20/2014 0800   MCH 23.9* 03/20/2014 0800   MCHC 32.8 03/20/2014 0800   RDW 16.9* 03/20/2014 0800   LYMPHSABS 2.4 03/19/2014 2213   MONOABS 0.8 03/19/2014 2213   EOSABS 0.1 03/19/2014 2213   BASOSABS 0.0 03/19/2014 2213   HEPATIC Function Panel  Recent Labs  09/13/13 1320 09/16/13 0800 03/19/14 2213  PROT 6.1 6.6 7.5   HEMOGLOBIN A1C No components found with this basename: HGA1C,  MPG   CARDIAC ENZYMES Lab Results  Component Value Date   CKTOTAL 70 11/24/2010   CKMB 1.5 11/24/2010   TROPONINI <0.30 03/20/2014   TROPONINI <0.30 03/20/2014   TROPONINI <0.30 09/13/2013   BNP  Recent Labs  09/13/13 0640 03/19/14 2331  PROBNP 137.2 533.4*   TSH  Recent Labs  09/13/13 1320  TSH 0.641   CHOLESTEROL  Recent Labs  03/20/14 0800  CHOL 125    Scheduled Meds: .  amLODipine  2.5 mg Oral Daily  . atorvastatin  40 mg Oral Daily  . carvedilol  3.125 mg Oral BID WC  . cloNIDine  0.2 mg Oral Daily  . finasteride  5 mg Oral Daily  . latanoprost  1 drop Both Eyes QHS  . lisinopril  20 mg Oral BID  . sodium chloride  3 mL Intravenous Q12H  . sucralfate  1 g Oral QID   Continuous Infusions:  PRN Meds:.sodium chloride, acetaminophen, meclizine, nitroGLYCERIN, ondansetron (ZOFRAN) IV, sodium chloride  Assessment/Plan: Acute coronary syndrome  CAD  Acute upper GI bleed  Anemia of blood loss and iron deficiency anemia  H/O Antral AV malformation  H/O Aortic stenosis  S/P Prosthetic AV replacement  Hypertension  Peripheral vascular disease  DM, II  Dyslipidemia  Glaucoma  Continue medical treatment.   LOS: 1 day    Dixie Dials  MD  03/20/2014, 8:08 PM

## 2014-03-21 NOTE — Progress Notes (Signed)
03/21/2014 0756  To ENDO  Cull[place], Carolynn Comment

## 2014-03-21 NOTE — Progress Notes (Signed)
Ref: Brian Demark, MD   Subjective:  Feeling better. Mild gastritis per Dr. Carol Ada.  Objective:  Vital Signs in the last 24 hours: Temp:  [97.4 F (36.3 C)-98.2 F (36.8 C)] 97.4 F (36.3 C) (06/19 1142) Pulse Rate:  [45-64] 45 (06/19 1230) Cardiac Rhythm:  [-] Sinus bradycardia (06/19 0742) Resp:  [13-24] 16 (06/19 1230) BP: (73-180)/(23-133) 104/44 mmHg (06/19 1230) SpO2:  [100 %] 100 % (06/19 1230) Weight:  [80.6 kg (177 lb 11.1 oz)] 80.6 kg (177 lb 11.1 oz) (06/19 0600)  Physical Exam: BP Readings from Last 1 Encounters:  03/21/14 104/44    Wt Readings from Last 1 Encounters:  03/21/14 80.6 kg (177 lb 11.1 oz)    Weight change: -6.037 kg (-13 lb 5 oz)  HEENT: [place]/AT, Eyes-Brown, PERL, EOMI, Conjunctiva-Pale, Sclera-Non-icteric Neck: No JVD, No bruit, Trachea midline. Lungs:  Clear, Bilateral. Cardiac:  Regular rhythm, normal S1 and S2, no S3. III/VI systolic murmur Abdomen:  Soft, non-tender. Extremities:  No edema present. No cyanosis. No clubbing. CNS: AxOx3, Cranial nerves grossly intact, moves all 4 extremities. Right handed. Skin: Warm and dry.   Intake/Output from previous day: 06/18 0701 - 06/19 0700 In: 636 [P.O.:630; I.V.:6] Out: 3250 [Urine:3250]    Lab Results: BMET    Component Value Date/Time   NA 140 03/20/2014 0800   NA 141 03/19/2014 2213   NA 133* 09/19/2013 0535   K 3.8 03/20/2014 0800   K 3.7 03/19/2014 2213   K 4.0 09/19/2013 0535   CL 106 03/20/2014 0800   CL 104 03/19/2014 2213   CL 101 09/19/2013 0535   CO2 23 03/20/2014 0800   CO2 19 03/19/2014 2213   CO2 23 09/19/2013 0535   GLUCOSE 102* 03/20/2014 0800   GLUCOSE 118* 03/19/2014 2213   GLUCOSE 99 09/19/2013 0535   BUN 14 03/20/2014 0800   BUN 18 03/19/2014 2213   BUN 10 09/19/2013 0535   CREATININE 1.24 03/20/2014 0800   CREATININE 1.41* 03/19/2014 2213   CREATININE 1.34 09/19/2013 0535   CALCIUM 9.4 03/20/2014 0800   CALCIUM 9.4 03/19/2014 2213   CALCIUM 8.8 09/19/2013 0535    GFRNONAA 54* 03/20/2014 0800   GFRNONAA 46* 03/19/2014 2213   GFRNONAA 49* 09/19/2013 0535   GFRAA 62* 03/20/2014 0800   GFRAA 54* 03/19/2014 2213   GFRAA 57* 09/19/2013 0535   CBC    Component Value Date/Time   WBC 7.7 03/21/2014 0153   RBC 3.29* 03/21/2014 0153   HGB 7.9* 03/21/2014 0153   HCT 24.1* 03/21/2014 0153   PLT 258 03/21/2014 0153   MCV 73.3* 03/21/2014 0153   MCH 24.0* 03/21/2014 0153   MCHC 32.8 03/21/2014 0153   RDW 16.7* 03/21/2014 0153   LYMPHSABS 2.4 03/19/2014 2213   MONOABS 0.8 03/19/2014 2213   EOSABS 0.1 03/19/2014 2213   BASOSABS 0.0 03/19/2014 2213   HEPATIC Function Panel  Recent Labs  09/13/13 1320 09/16/13 0800 03/19/14 2213  PROT 6.1 6.6 7.5   HEMOGLOBIN A1C No components found with this basename: HGA1C,  MPG   CARDIAC ENZYMES Lab Results  Component Value Date   CKTOTAL 70 11/24/2010   CKMB 1.5 11/24/2010   TROPONINI <0.30 03/20/2014   TROPONINI <0.30 03/20/2014   TROPONINI <0.30 03/20/2014   BNP  Recent Labs  09/13/13 0640 03/19/14 2331  PROBNP 137.2 533.4*   TSH  Recent Labs  09/13/13 1320  TSH 0.641   CHOLESTEROL  Recent Labs  03/20/14 0800  CHOL 125  Scheduled Meds: . amLODipine  2.5 mg Oral Daily  . atorvastatin  40 mg Oral Daily  . carvedilol  3.125 mg Oral BID WC  . cloNIDine  0.2 mg Oral Daily  . finasteride  5 mg Oral Daily  . latanoprost  1 drop Both Eyes QHS  . lisinopril  20 mg Oral BID  . sodium chloride  3 mL Intravenous Q12H  . sucralfate  1 g Oral QID   Continuous Infusions:  PRN Meds:.sodium chloride, acetaminophen, meclizine, nitroGLYCERIN, ondansetron (ZOFRAN) IV, sodium chloride  Assessment/Plan: Acute coronary syndrome  CAD  Acute upper GI bleed  Anemia of blood loss and iron deficiency anemia  H/O Antral AV malformation  H/O Aortic stenosis  S/P Prosthetic AV replacement  Hypertension  Peripheral vascular disease  DM, II  Dyslipidemia  Glaucoma  Continue medical treatment.   LOS: 2 days     Dixie Dials  MD  03/21/2014, 3:54 PM

## 2014-03-21 NOTE — Interval H&P Note (Signed)
History and Physical Interval Note:  03/21/2014 8:30 AM  [name]  has presented today for surgery, with the diagnosis of Anemia and Melena  The various methods of treatment have been discussed with the patient and family. After consideration of risks, benefits and other options for treatment, the patient has consented to  Procedure(s): ESOPHAGOGASTRODUODENOSCOPY (EGD) (N/A) as a surgical intervention .  The patient's history has been reviewed, patient examined, no change in status, stable for surgery.  I have reviewed the patient's chart and labs.  Questions were answered to the patient's satisfaction.     HUNG,PATRICK D

## 2014-03-21 NOTE — Op Note (Signed)
[facility] [place], [id number]   OPERATIVE PROCEDURE REPORT  PATIENT: [name], [name]  MR#: [id number] BIRTHDATE: [birth year]  GENDER: Male ENDOSCOPIST: Carol Ada, MD ASSISTANT:   Verlon Au, RN, BSN and Corliss Parish, technician PROCEDURE DATE: 03/21/2014 PROCEDURE:   EGD w/ biopsy ASA CLASS:   Class III INDICATIONS:Anemia/Heme positive stool/Melena. MEDICATIONS: Fentanyl 50 mcg IV and Versed 4 mg IV TOPICAL ANESTHETIC:   none  DESCRIPTION OF PROCEDURE:   After the risks benefits and alternatives of the procedure were thoroughly explained, informed consent was obtained.  The Pentax Gastroscope [id number]  endoscope was introduced through the mouth  and advanced to the second portion of the duodenum Without limitations.      The instrument was slowly withdrawn as the mucosa was fully examined.      FINDINGS: The esophagus was normal.  The gastric lumen exhibited a localized gastritis in the body of the stomach.  Cold biopsies were obtained.  No evidence of any AVMs during this examination. No ulcerations or erosions.  The duodenum was normal.          The scope was then withdrawn from the patient and the procedure terminated.  COMPLICATIONS: There were no complications.  IMPRESSION: 1) Mild gastritis.  RECOMMENDATIONS: 1) Await biopsy results. 2) PPI. 3) No further GI evaluation at this time.  Signing off.  _______________________________ eSigned:  Carol Ada, MD 03/21/2014 8:49 AM

## 2014-03-22 LAB — CBC
HCT: 25.6 % — ABNORMAL LOW (ref 39.0–52.0)
Hemoglobin: 8.2 g/dL — ABNORMAL LOW (ref 13.0–17.0)
MCH: 23.6 pg — ABNORMAL LOW (ref 26.0–34.0)
MCHC: 32 g/dL (ref 30.0–36.0)
MCV: 73.6 fL — ABNORMAL LOW (ref 78.0–100.0)
PLATELETS: 278 10*3/uL (ref 150–400)
RBC: 3.48 MIL/uL — ABNORMAL LOW (ref 4.22–5.81)
RDW: 17 % — ABNORMAL HIGH (ref 11.5–15.5)
WBC: 9.6 10*3/uL (ref 4.0–10.5)

## 2014-03-22 LAB — GLUCOSE, CAPILLARY
GLUCOSE-CAPILLARY: 123 mg/dL — AB (ref 70–99)
Glucose-Capillary: 135 mg/dL — ABNORMAL HIGH (ref 70–99)
Glucose-Capillary: 115 mg/dL — ABNORMAL HIGH (ref 70–99)

## 2014-03-22 NOTE — Progress Notes (Signed)
Ref: Clent Demark, MD   Subjective:  Holding hemoglobin level. Some epigastric discomfort.  Objective:  Vital Signs in the last 24 hours: Temp:  [97.4 F (36.3 C)-98.3 F (36.8 C)] 98.3 F (36.8 C) (06/20 0404) Pulse Rate:  [45-64] 58 (06/20 0315) Cardiac Rhythm:  [-] Sinus bradycardia (06/19 2000) Resp:  [13-24] 16 (06/20 0315) BP: (73-180)/(23-133) 119/46 mmHg (06/20 0315) SpO2:  [100 %] 100 % (06/20 0404) Weight:  [81.8 kg (180 lb 5.4 oz)] 81.8 kg (180 lb 5.4 oz) (06/20 0404)  Physical Exam: BP Readings from Last 1 Encounters:  03/22/14 119/46    Wt Readings from Last 1 Encounters:  03/22/14 81.8 kg (180 lb 5.4 oz)    Weight change: 1.2 kg (2 lb 10.3 oz)  HEENT: [place]/AT, Eyes-Brown, wears glasses, PERL, EOMI, Conjunctiva-Pale, Sclera-Non-icteric Neck: No JVD, No bruit, Trachea midline. Lungs:  Clear, Bilateral. Cardiac:  Regular rhythm, normal S1 and S2, no S3. III/VI systolic murmur. Abdomen:  Soft, epigastric area-tender. Extremities:  No edema present. No cyanosis. No clubbing. CNS: AxOx3, Cranial nerves grossly intact, moves all 4 extremities. Right handed. Skin: Warm and dry.   Intake/Output from previous day: 06/19 0701 - 06/20 0700 In: 600 [P.O.:600] Out: 201 [Urine:200; Stool:1]    Lab Results: BMET    Component Value Date/Time   NA 140 03/20/2014 0800   NA 141 03/19/2014 2213   NA 133* 09/19/2013 0535   K 3.8 03/20/2014 0800   K 3.7 03/19/2014 2213   K 4.0 09/19/2013 0535   CL 106 03/20/2014 0800   CL 104 03/19/2014 2213   CL 101 09/19/2013 0535   CO2 23 03/20/2014 0800   CO2 19 03/19/2014 2213   CO2 23 09/19/2013 0535   GLUCOSE 102* 03/20/2014 0800   GLUCOSE 118* 03/19/2014 2213   GLUCOSE 99 09/19/2013 0535   BUN 14 03/20/2014 0800   BUN 18 03/19/2014 2213   BUN 10 09/19/2013 0535   CREATININE 1.24 03/20/2014 0800   CREATININE 1.41* 03/19/2014 2213   CREATININE 1.34 09/19/2013 0535   CALCIUM 9.4 03/20/2014 0800   CALCIUM 9.4 03/19/2014 2213   CALCIUM  8.8 09/19/2013 0535   GFRNONAA 54* 03/20/2014 0800   GFRNONAA 46* 03/19/2014 2213   GFRNONAA 49* 09/19/2013 0535   GFRAA 62* 03/20/2014 0800   GFRAA 54* 03/19/2014 2213   GFRAA 57* 09/19/2013 0535   CBC    Component Value Date/Time   WBC 9.6 03/22/2014 0307   RBC 3.48* 03/22/2014 0307   HGB 8.2* 03/22/2014 0307   HCT 25.6* 03/22/2014 0307   PLT 278 03/22/2014 0307   MCV 73.6* 03/22/2014 0307   MCH 23.6* 03/22/2014 0307   MCHC 32.0 03/22/2014 0307   RDW 17.0* 03/22/2014 0307   LYMPHSABS 2.4 03/19/2014 2213   MONOABS 0.8 03/19/2014 2213   EOSABS 0.1 03/19/2014 2213   BASOSABS 0.0 03/19/2014 2213   HEPATIC Function Panel  Recent Labs  09/13/13 1320 09/16/13 0800 03/19/14 2213  PROT 6.1 6.6 7.5   HEMOGLOBIN A1C No components found with this basename: HGA1C,  MPG   CARDIAC ENZYMES Lab Results  Component Value Date   CKTOTAL 70 11/24/2010   CKMB 1.5 11/24/2010   TROPONINI <0.30 03/20/2014   TROPONINI <0.30 03/20/2014   TROPONINI <0.30 03/20/2014   BNP  Recent Labs  09/13/13 0640 03/19/14 2331  PROBNP 137.2 533.4*   TSH  Recent Labs  09/13/13 1320  TSH 0.641   CHOLESTEROL  Recent Labs  03/20/14 0800  CHOL 125  Scheduled Meds: . amLODipine  2.5 mg Oral Daily  . atorvastatin  40 mg Oral Daily  . carvedilol  3.125 mg Oral BID WC  . cloNIDine  0.2 mg Oral Daily  . finasteride  5 mg Oral Daily  . latanoprost  1 drop Both Eyes QHS  . lisinopril  20 mg Oral BID  . sodium chloride  3 mL Intravenous Q12H  . sucralfate  1 g Oral QID   Continuous Infusions:  PRN Meds:.sodium chloride, acetaminophen, meclizine, nitroGLYCERIN, ondansetron (ZOFRAN) IV, sodium chloride  Assessment/Plan: Acute coronary syndrome  CAD  Acute upper GI bleed  Anemia of blood loss and iron deficiency anemia  H/O Antral AV malformation  H/O Aortic stenosis  S/P Prosthetic AV replacement  Hypertension  Peripheral vascular disease  DM, II  Dyslipidemia  Glaucoma  Transfer to telemetry.     LOS: 3 days    Dixie Dials  MD  03/22/2014, 7:41 AM

## 2014-03-23 LAB — CBC
HEMATOCRIT: 28 % — AB (ref 39.0–52.0)
HEMOGLOBIN: 9 g/dL — AB (ref 13.0–17.0)
MCH: 23.6 pg — ABNORMAL LOW (ref 26.0–34.0)
MCHC: 32.1 g/dL (ref 30.0–36.0)
MCV: 73.5 fL — ABNORMAL LOW (ref 78.0–100.0)
Platelets: 314 10*3/uL (ref 150–400)
RBC: 3.81 MIL/uL — ABNORMAL LOW (ref 4.22–5.81)
RDW: 17.3 % — AB (ref 11.5–15.5)
WBC: 10.4 10*3/uL (ref 4.0–10.5)

## 2014-03-23 LAB — GLUCOSE, CAPILLARY
Glucose-Capillary: 109 mg/dL — ABNORMAL HIGH (ref 70–99)
Glucose-Capillary: 97 mg/dL (ref 70–99)

## 2014-03-23 MED ORDER — CARVEDILOL 3.125 MG PO TABS: 3.1250 mg | ORAL_TABLET | Freq: Two times a day (BID) | ORAL | Status: AC

## 2014-03-23 MED ORDER — SUCRALFATE 1 GM/10ML PO SUSP
1.0000 g | Freq: Once | ORAL | Status: AC
  Administered 2014-03-23: 1 g via ORAL
  Filled 2014-03-23: qty 10

## 2014-03-23 NOTE — Discharge Summary (Signed)
Physician Discharge Summary  Patient ID: [name] MRN: [id number] DOB/AGE: [birth year] 80 y.o.  Admit date: 03/19/2014 Discharge date: 03/23/2014  Admission Diagnoses: Acute coronary syndrome  CAD  Acute upper GI bleed  Anemia of blood loss and iron deficiency anemia  H/O Antral AV malformation  H/O Aortic stenosis  S/P Prosthetic AV replacement  Hypertension  Peripheral vascular disease  DM, II  Dyslipidemia  Glaucoma  Discharge Diagnoses:  Principle Problem: * Acute coronary syndrome * CAD  Acute upper GI bleed  Anemia of blood loss and iron deficiency anemia  H/O Antral AV malformation  H/O Aortic stenosis  S/P Prosthetic AV replacement  Hypertension  Peripheral vascular disease  DM, II  Dyslipidemia  Glaucoma   Discharged Condition: fair  Hospital Course: 80 y.o. male with hx of CAD, HTN, Aortic stenosis s/p aortic valve replacement  Was admitted with exertional shortness of breath, progressively worse over the last several weeks. He had some chest pressure as well. Denies PND or worsening leg swelling. Has intermittent melena as well. Also noticed left lower leg numbness and cooler to touch. EKG showed infero-lateral depression. With low hemoglobin he got 1 unit PRBC transfusion and GI consult. He underwent EGD by Dr. Carol Ada showing gastritis. Next 2 days his Hgb remained stable. Patient understood to avoid spicy, fried and acidic foods and use Carafate and TUMS as directed. He was discharged home with follow up by Dr. Terrence Dupont in 1 week. He will remain off all blood thinners for now.  Consults: cardiology and GI  Significant Diagnostic Studies: labs: Hgb 8.6 to 9.0 post transfusion. Normal Lipid level, troponin-I and BMET  Chest x-ray: No acute abnormality with evidence of aortic valve replacement.  EKG: Sinus bradycardia and infero-lateral ischemia.  EGD showed mild gastritis.  Echocardiogram :- Left ventricle: The cavity size was normal. There was  mild concentric hypertrophy. Systolic function was mildly reduced. The estimated ejection fraction was in the range of 45% to 50%. There is mild hypokinesis of the entireinferolateral myocardium. Doppler parameters are consistent with abnormal left ventricular relaxation (grade 1 diastolic dysfunction). - Aortic valve: A prosthesis was present. Valve mobility was restricted. Transvalvular velocity was increased more than expected, due to stenosis. There was severe stenosis. There was mild regurgitation directed eccentrically in the LVOT. - Mitral valve: Calcified annulus. There was mild regurgitation. - Left atrium: The atrium was mildly dilated. - Right ventricle: Systolic function was mildly reduced.  Treatments: cardiac meds: quinapril (Accupril), carvedilol, amlodipine and clonidine and procedures: EGD.  Discharge Exam: Blood pressure 148/59, pulse 65, temperature 98.1 F (36.7 C), temperature source Oral, resp. rate 18, height 5\' 11"  (1.803 m), weight 83.008 kg (183 lb), SpO2 100.00%. HEENT: [place]/AT, Eyes-Brown, PERL, EOMI, Conjunctiva-Pale, Sclera-Non-icteric  Neck: No JVD, No bruit, Trachea midline.  Lungs: Clear, Bilateral.  Cardiac: Regular rhythm, normal S1 and S2, no S3. III/VI systolic murmur  Abdomen: Soft, non-tender.  Extremities: No edema present. No cyanosis. No clubbing.  CNS: AxOx3, Cranial nerves grossly intact, moves all 4 extremities. Right handed.  Skin: Warm and dry.  Disposition: 01-Home or Self Care     Medication List         amLODipine 2.5 MG tablet  Commonly known as:  NORVASC  Take 2.5 mg by mouth daily.     atorvastatin 40 MG tablet  Commonly known as:  LIPITOR  Take 1 tablet by mouth daily.     bimatoprost 0.01 % Soln  Commonly known as:  LUMIGAN  Place 1 drop  into both eyes at bedtime.     carvedilol 3.125 MG tablet  Commonly known as:  COREG  Take 1 tablet (3.125 mg total) by mouth 2 (two) times daily with a meal.     cloNIDine 0.2 MG tablet   Commonly known as:  CATAPRES  Take 0.2 mg by mouth daily.     finasteride 5 MG tablet  Commonly known as:  PROSCAR  Take 5 mg by mouth daily.     meclizine 12.5 MG tablet  Commonly known as:  ANTIVERT  Take 1 tablet (12.5 mg total) by mouth 3 (three) times daily as needed for dizziness.     quinapril 20 MG tablet  Commonly known as:  ACCUPRIL  Take 20 mg by mouth 2 (two) times daily.     sucralfate 1 GM/10ML suspension  Commonly known as:  CARAFATE  Take 1 g by mouth 4 (four) times daily.           Follow-up Information   Follow up with Clent Demark, MD. Schedule an appointment as soon as possible for a visit in 1 week.   Specialty:  Cardiology   Contact information:   [place] [address] [place] [address] [phone]       Signed: Birdie Riddle 03/23/2014, 2:54 PM

## 2014-03-24 ENCOUNTER — Encounter (HOSPITAL_COMMUNITY): Payer: Self-pay | Admitting: Gastroenterology

## 2014-04-03 ENCOUNTER — Inpatient Hospital Stay (HOSPITAL_COMMUNITY)
Admission: EM | Admit: 2014-04-03 | Discharge: 2014-04-07 | DRG: 379 | Disposition: A | Discharge status: Home / Self Care | Payer: Medicare Other | Attending: Cardiology | Admitting: Cardiology

## 2014-04-03 ENCOUNTER — Encounter (HOSPITAL_COMMUNITY): Payer: Self-pay | Admitting: Emergency Medicine

## 2014-04-03 DIAGNOSIS — D500 Iron deficiency anemia secondary to blood loss (chronic): Secondary | ICD-10-CM | POA: Diagnosis present

## 2014-04-03 DIAGNOSIS — E119 Type 2 diabetes mellitus without complications: Secondary | ICD-10-CM | POA: Diagnosis present

## 2014-04-03 DIAGNOSIS — Z860100 Personal history of colon polyps, unspecified: Secondary | ICD-10-CM

## 2014-04-03 DIAGNOSIS — E7800 Pure hypercholesterolemia, unspecified: Secondary | ICD-10-CM | POA: Diagnosis present

## 2014-04-03 DIAGNOSIS — Z954 Presence of other heart-valve replacement: Secondary | ICD-10-CM

## 2014-04-03 DIAGNOSIS — Z87891 Personal history of nicotine dependence: Secondary | ICD-10-CM

## 2014-04-03 DIAGNOSIS — K922 Gastrointestinal hemorrhage, unspecified: Secondary | ICD-10-CM | POA: Diagnosis present

## 2014-04-03 DIAGNOSIS — I2510 Atherosclerotic heart disease of native coronary artery without angina pectoris: Secondary | ICD-10-CM | POA: Diagnosis present

## 2014-04-03 DIAGNOSIS — I10 Essential (primary) hypertension: Secondary | ICD-10-CM | POA: Diagnosis present

## 2014-04-03 DIAGNOSIS — D509 Iron deficiency anemia, unspecified: Secondary | ICD-10-CM

## 2014-04-03 DIAGNOSIS — T465X5A Adverse effect of other antihypertensive drugs, initial encounter: Secondary | ICD-10-CM | POA: Diagnosis present

## 2014-04-03 DIAGNOSIS — I951 Orthostatic hypotension: Secondary | ICD-10-CM | POA: Diagnosis present

## 2014-04-03 DIAGNOSIS — K921 Melena: Principal | ICD-10-CM | POA: Diagnosis present

## 2014-04-03 DIAGNOSIS — R195 Other fecal abnormalities: Secondary | ICD-10-CM

## 2014-04-03 DIAGNOSIS — Z79899 Other long term (current) drug therapy: Secondary | ICD-10-CM

## 2014-04-03 DIAGNOSIS — K219 Gastro-esophageal reflux disease without esophagitis: Secondary | ICD-10-CM | POA: Diagnosis present

## 2014-04-03 LAB — CBC WITH DIFFERENTIAL/PLATELET
Basophils Absolute: 0.1 10*3/uL (ref 0.0–0.1)
Basophils Relative: 1 % (ref 0–1)
Eosinophils Absolute: 0.2 10*3/uL (ref 0.0–0.7)
Eosinophils Relative: 2 % (ref 0–5)
HEMATOCRIT: 29.6 % — AB (ref 39.0–52.0)
HEMOGLOBIN: 9.4 g/dL — AB (ref 13.0–17.0)
LYMPHS PCT: 24 % (ref 12–46)
Lymphs Abs: 2.3 10*3/uL (ref 0.7–4.0)
MCH: 22.6 pg — ABNORMAL LOW (ref 26.0–34.0)
MCHC: 31.8 g/dL (ref 30.0–36.0)
MCV: 71.2 fL — ABNORMAL LOW (ref 78.0–100.0)
MONO ABS: 0.7 10*3/uL (ref 0.1–1.0)
MONOS PCT: 8 % (ref 3–12)
Neutro Abs: 6.2 10*3/uL (ref 1.7–7.7)
Neutrophils Relative %: 65 % (ref 43–77)
Platelets: 411 10*3/uL — ABNORMAL HIGH (ref 150–400)
RBC: 4.16 MIL/uL — ABNORMAL LOW (ref 4.22–5.81)
RDW: 17.3 % — ABNORMAL HIGH (ref 11.5–15.5)
WBC: 9.5 10*3/uL (ref 4.0–10.5)

## 2014-04-03 LAB — COMPREHENSIVE METABOLIC PANEL
ALT: 22 U/L (ref 0–53)
ANION GAP: 15 (ref 5–15)
AST: 26 U/L (ref 0–37)
Albumin: 3.6 g/dL (ref 3.5–5.2)
Alkaline Phosphatase: 82 U/L (ref 39–117)
BILIRUBIN TOTAL: 0.5 mg/dL (ref 0.3–1.2)
BUN: 17 mg/dL (ref 6–23)
CALCIUM: 9.5 mg/dL (ref 8.4–10.5)
CHLORIDE: 100 meq/L (ref 96–112)
CO2: 22 meq/L (ref 19–32)
CREATININE: 1.5 mg/dL — AB (ref 0.50–1.35)
GFR, EST AFRICAN AMERICAN: 50 mL/min — AB (ref >90)
GFR, EST NON AFRICAN AMERICAN: 43 mL/min — AB (ref >90)
GLUCOSE: 161 mg/dL — AB (ref 70–99)
Potassium: 4.2 mEq/L (ref 3.7–5.3)
Sodium: 137 mEq/L (ref 137–147)
Total Protein: 7.9 g/dL (ref 6.0–8.3)

## 2014-04-03 LAB — POC OCCULT BLOOD, ED: Fecal Occult Bld: POSITIVE — AB

## 2014-04-03 LAB — CBG MONITORING, ED: Glucose-Capillary: 128 mg/dL — ABNORMAL HIGH (ref 70–99)

## 2014-04-03 MED ORDER — CARVEDILOL 3.125 MG PO TABS
3.1250 mg | ORAL_TABLET | Freq: Two times a day (BID) | ORAL | Status: DC
  Administered 2014-04-04 – 2014-04-07 (×6): 3.125 mg via ORAL
  Filled 2014-04-03 (×9): qty 1

## 2014-04-03 MED ORDER — FINASTERIDE 5 MG PO TABS
5.0000 mg | ORAL_TABLET | Freq: Every day | ORAL | Status: DC
Start: 2014-04-04 — End: 2014-04-07
  Administered 2014-04-04 – 2014-04-07 (×4): 5 mg via ORAL
  Filled 2014-04-03 (×4): qty 1

## 2014-04-03 MED ORDER — SODIUM CHLORIDE 0.9 % IV SOLN
1000.0000 mL | INTRAVENOUS | Status: DC
  Administered 2014-04-03: 1000 mL via INTRAVENOUS

## 2014-04-03 MED ORDER — SENNA 8.6 MG PO TABS
1.0000 | ORAL_TABLET | Freq: Two times a day (BID) | ORAL | Status: DC
  Administered 2014-04-04 – 2014-04-07 (×8): 8.6 mg via ORAL
  Filled 2014-04-03 (×9): qty 1

## 2014-04-03 MED ORDER — ATORVASTATIN CALCIUM 40 MG PO TABS
40.0000 mg | ORAL_TABLET | Freq: Every day | ORAL | Status: DC
Start: 2014-04-04 — End: 2014-04-07
  Administered 2014-04-04 – 2014-04-07 (×4): 40 mg via ORAL
  Filled 2014-04-03 (×4): qty 1

## 2014-04-03 MED ORDER — PANTOPRAZOLE SODIUM 40 MG PO TBEC
40.0000 mg | DELAYED_RELEASE_TABLET | Freq: Two times a day (BID) | ORAL | Status: DC
  Administered 2014-04-04 – 2014-04-07 (×8): 40 mg via ORAL
  Filled 2014-04-03 (×6): qty 1

## 2014-04-03 MED ORDER — MECLIZINE HCL 12.5 MG PO TABS
12.5000 mg | ORAL_TABLET | Freq: Three times a day (TID) | ORAL | Status: DC | PRN
  Filled 2014-04-03: qty 1

## 2014-04-03 MED ORDER — SODIUM CHLORIDE 0.9 % IV SOLN
INTRAVENOUS | Status: DC
  Administered 2014-04-04 – 2014-04-06 (×4): via INTRAVENOUS

## 2014-04-03 MED ORDER — SODIUM CHLORIDE 0.9 % IV SOLN
1000.0000 mL | Freq: Once | INTRAVENOUS | Status: AC
  Administered 2014-04-03: 1000 mL via INTRAVENOUS

## 2014-04-03 MED ORDER — SUCRALFATE 1 GM/10ML PO SUSP
1.0000 g | Freq: Four times a day (QID) | ORAL | Status: DC
  Administered 2014-04-04 – 2014-04-07 (×12): 1 g via ORAL
  Filled 2014-04-03 (×17): qty 10

## 2014-04-03 MED ORDER — INSULIN ASPART 100 UNIT/ML ~~LOC~~ SOLN: 0.0000 [IU] | Freq: Three times a day (TID) | SUBCUTANEOUS | Status: DC

## 2014-04-03 NOTE — ED Provider Notes (Signed)
CSN: [id number]     Arrival date & time 04/03/14  1915 History   First MD Initiated Contact with Patient 04/03/14 1934     Chief Complaint  Patient presents with  . Weakness  . Dizziness     (Consider location/radiation/quality/duration/timing/severity/associated sxs/prior Treatment) HPI 80 yo M with hx of CAD, HTN, GERD, AS, GI bleed, presents with abdominal pain (burning epigastric pain) without radiation, now resolved, and lightheadedness and DOE. Denies chest pain. Denies syncope.   Past Medical History  Diagnosis Date  . Heart valve disease   . Coronary artery disease   . Hypertension   . GERD (gastroesophageal reflux disease)   . Diabetes mellitus without complication   . Aortic stenosis, severe   . S/P AVR (aortic valve replacement)     on chronic coagulation  . History of colonic polyps   . GI bleed     hx of  . Shortness of breath     WITH ACTIVITY  . Heart murmur    Past Surgical History  Procedure Laterality Date  . Esophagogastroduodenoscopy  09/11/2012    Procedure: ESOPHAGOGASTRODUODENOSCOPY (EGD);  Surgeon: Beryle Beams, MD;  Location: [facility] ENDOSCOPY;  Service: Endoscopy;  Laterality: N/A;  . Cardiac surgery      valve surgery  . Cholecystectomy    . Esophagogastroduodenoscopy N/A 09/17/2013    Procedure: ESOPHAGOGASTRODUODENOSCOPY (EGD);  Surgeon: Beryle Beams, MD;  Location: [facility] ENDOSCOPY;  Service: Endoscopy;  Laterality: N/A;  . Esophagogastroduodenoscopy N/A 03/21/2014    Procedure: ESOPHAGOGASTRODUODENOSCOPY (EGD);  Surgeon: Beryle Beams, MD;  Location: [facility] ENDOSCOPY;  Service: Endoscopy;  Laterality: N/A;   No family history on file. History  Substance Use Topics  . Smoking status: Former Research scientist (life sciences)  . Smokeless tobacco: Never Used     Comment: QUIT SMOKING BACK IN THE 90"S  . Alcohol Use: No    Review of Systems  Constitutional: Positive for fatigue. Negative for fever and chills.  HENT: Negative for sore throat.   Eyes: Negative for pain.   Respiratory: Negative for cough and shortness of breath.   Cardiovascular: Negative for chest pain.  Gastrointestinal: Positive for abdominal pain. Negative for nausea and vomiting.  Genitourinary: Negative for dysuria and flank pain.  Musculoskeletal: Negative for back pain and neck pain.  Skin: Negative for rash.  Neurological: Positive for light-headedness. Negative for seizures and headaches.      Allergies  Review of patient's allergies indicates no known allergies.  Home Medications   Prior to Admission medications   Medication Sig Start Date End Date Taking? Authorizing Provider  amLODipine (NORVASC) 2.5 MG tablet Take 2.5 mg by mouth daily.    Yes Historical Provider, MD  atorvastatin (LIPITOR) 40 MG tablet Take 1 tablet by mouth daily. 08/05/13  Yes Historical Provider, MD  bimatoprost (LUMIGAN) 0.01 % SOLN Place 1 drop into both eyes at bedtime.   Yes Historical Provider, MD  carvedilol (COREG) 3.125 MG tablet Take 1 tablet (3.125 mg total) by mouth 2 (two) times daily with a meal. 03/23/14  Yes Birdie Riddle, MD  finasteride (PROSCAR) 5 MG tablet Take 5 mg by mouth daily.   Yes Historical Provider, MD  meclizine (ANTIVERT) 12.5 MG tablet Take 1 tablet (12.5 mg total) by mouth 3 (three) times daily as needed for dizziness. 11/30/12  Yes Kalman Drape, MD  quinapril (ACCUPRIL) 20 MG tablet Take 20 mg by mouth 2 (two) times daily.   Yes Historical Provider, MD  sucralfate (CARAFATE) 1 GM/10ML suspension  Take 1 g by mouth 4 (four) times daily.   Yes Historical Provider, MD   BP 114/58  Pulse 71  Temp(Src) 98.3 F (36.8 C) (Oral)  Resp 18  Ht 5\' 11"  (1.803 m)  Wt 182 lb 3.2 oz (82.645 kg)  BMI 25.42 kg/m2  SpO2 100% Physical Exam  Constitutional: He is oriented to person, place, and time. He appears well-developed and well-nourished. No distress.  HENT:  Head: Normocephalic and atraumatic.  Eyes: Pupils are equal, round, and reactive to light.  Neck: Normal range of  motion.  Cardiovascular: Normal rate and regular rhythm.   Murmur heard.  Crescendo decrescendo systolic murmur is present with a grade of 3/6  Pulmonary/Chest: Effort normal and breath sounds normal.  Abdominal: Soft. He exhibits no distension. There is no tenderness.  Musculoskeletal: Normal range of motion.  Neurological: He is alert and oriented to person, place, and time.  Skin: Skin is warm. He is not diaphoretic.    ED Course  Procedures (including critical care time) Labs Review Labs Reviewed  CBC WITH DIFFERENTIAL - Abnormal; Notable for the following:    RBC 4.16 (*)    Hemoglobin 9.4 (*)    HCT 29.6 (*)    MCV 71.2 (*)    MCH 22.6 (*)    RDW 17.3 (*)    Platelets 411 (*)    All other components within normal limits  COMPREHENSIVE METABOLIC PANEL - Abnormal; Notable for the following:    Glucose, Bld 161 (*)    Creatinine, Ser 1.50 (*)    GFR calc non Af Amer 43 (*)    GFR calc Af Amer 50 (*)    All other components within normal limits  CBG MONITORING, ED - Abnormal; Notable for the following:    Glucose-Capillary 128 (*)    All other components within normal limits  POC OCCULT BLOOD, ED - Abnormal; Notable for the following:    Fecal Occult Bld POSITIVE (*)    All other components within normal limits  URINALYSIS, ROUTINE W REFLEX MICROSCOPIC  CBC  BASIC METABOLIC PANEL  HEMOGLOBIN A1C  HEMOGLOBIN AND HEMATOCRIT, BLOOD  HEMOGLOBIN AND HEMATOCRIT, BLOOD  HEMOGLOBIN AND HEMATOCRIT, BLOOD  POCT CBG (FASTING - GLUCOSE)-MANUAL ENTRY    Imaging Review No results found.   EKG Interpretation   Date/Time:  Thursday April 03 2014 20:41:13 EDT Ventricular Rate:  70 PR Interval:  156 QRS Duration: 68 QT Interval:  406 QTC Calculation: 438 R Axis:   30 Text Interpretation:  Sinus rhythm Repol abnrm suggests ischemia,  anterolateral Septal infarct , age undetermined When compared with ECG of  03/21/2014, T wave inversion in the Inferior leads is no longer  Present  Confirmed by [facility]  MD, DAVID ([id number]) on 04/03/2014 9:46:39 PM      MDM   Final diagnoses:  None   80 yo M with hx of CAD, HTN, DM, AS, presents with fatigue and weakness and with abdominal burning pain.   Patient with recent admission for similar issues. Concern for symptomatic anemia. Will check basic labs. Hemoccult positive.   Labs demonstrate low but stable Hgb. EKG with no new abnormal findings, similar to prior. Patient with orthostatic hypotension, unable to get up and ambulate without symptoms. Will admit for further management and evaluation. Admitted to floor in stable condition. Patient seen and evaluated by myself and my attending, Dr. Roxanne Mins.      Freddi Che, MD 04/04/14 [phone]

## 2014-04-03 NOTE — ED Notes (Signed)
Pt. reports generalized weakness / dizziness onset yesterday , family reported history of GI bleed , hypotensive at triage .

## 2014-04-03 NOTE — H&P (Signed)
[name] is an 80 y.o. male.   Chief Complaint: Vague abdominal pain associated with dizziness/weakness HPI: Patient is 80 year old male with past medical history significant for multiple medical problems i.e. 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 Denies any bright red blood per rectum or melena. Denies syncopal episode. Patient was noted to be hypotensive in ED with blood pressure of 80 systolic. Patient was recently discharged from the hospital received one unit of packed RBC approximately 2 weeks ago. Had EGD done which showed a gastritis. With no evidence of acute bleed. Patient denies any chest pain nausea vomiting diaphoresis. Denies palpitation lightheadedness or syncope. States he has not been taking any aspirin or NSAIDs and has been off Coumadin.  Past Medical History  Diagnosis Date  . Heart valve disease   . Coronary artery disease   . Hypertension   . GERD (gastroesophageal reflux disease)   . Diabetes mellitus without complication   . Aortic stenosis, severe   . S/P AVR (aortic valve replacement)     on chronic coagulation  . History of colonic polyps   . GI bleed     hx of  . Shortness of breath     WITH ACTIVITY  . Heart murmur     Past Surgical History  Procedure Laterality Date  . Esophagogastroduodenoscopy  09/11/2012    Procedure: ESOPHAGOGASTRODUODENOSCOPY (EGD);  Surgeon: Beryle Beams, MD;  Location: [facility] ENDOSCOPY;  Service: Endoscopy;  Laterality: N/A;  . Cardiac surgery      valve surgery  . Cholecystectomy    .  Esophagogastroduodenoscopy N/A 09/17/2013    Procedure: ESOPHAGOGASTRODUODENOSCOPY (EGD);  Surgeon: Beryle Beams, MD;  Location: [facility] ENDOSCOPY;  Service: Endoscopy;  Laterality: N/A;  . Esophagogastroduodenoscopy N/A 03/21/2014    Procedure: ESOPHAGOGASTRODUODENOSCOPY (EGD);  Surgeon: Beryle Beams, MD;  Location: [facility] ENDOSCOPY;  Service: Endoscopy;  Laterality: N/A;    No family history on file. Social History:  reports that he has quit smoking. He has never used smokeless tobacco. He reports that he does not drink alcohol or use illicit drugs.  Allergies: No Known Allergies   (Not in a hospital admission)  Results for orders placed during the hospital encounter of 04/03/14 (from the past 48 hour(s))  CBC WITH DIFFERENTIAL     Status: Abnormal   Collection Time    04/03/14  7:39 PM      Result Value Ref Range   WBC 9.5  4.0 - 10.5 K/uL   RBC 4.16 (*) 4.22 - 5.81 MIL/uL   Hemoglobin 9.4 (*) 13.0 - 17.0 g/dL   HCT 29.6 (*) 39.0 - 52.0 %   MCV 71.2 (*) 78.0 - 100.0 fL   MCH 22.6 (*) 26.0 - 34.0 pg   MCHC 31.8  30.0 - 36.0 g/dL   RDW 17.3 (*) 11.5 - 15.5 %   Platelets 411 (*) 150 - 400 K/uL   Neutrophils Relative % 65  43 - 77 %   Neutro Abs 6.2  1.7 - 7.7 K/uL   Lymphocytes Relative 24  12 - 46 %   Lymphs Abs 2.3  0.7 - 4.0 K/uL   Monocytes  Relative 8  3 - 12 %   Monocytes Absolute 0.7  0.1 - 1.0 K/uL   Eosinophils Relative 2  0 - 5 %   Eosinophils Absolute 0.2  0.0 - 0.7 K/uL   Basophils Relative 1  0 - 1 %   Basophils Absolute 0.1  0.0 - 0.1 K/uL  COMPREHENSIVE METABOLIC PANEL     Status: Abnormal   Collection Time    04/03/14  7:39 PM      Result Value Ref Range   Sodium 137  137 - 147 mEq/L   Potassium 4.2  3.7 - 5.3 mEq/L   Chloride 100  96 - 112 mEq/L   CO2 22  19 - 32 mEq/L   Glucose, Bld 161 (*) 70 - 99 mg/dL   BUN 17  6 - 23 mg/dL   Creatinine, Ser 1.50 (*) 0.50 - 1.35 mg/dL   Calcium 9.5  8.4 - 10.5 mg/dL   Total Protein 7.9  6.0 - 8.3 g/dL   Albumin 3.6   3.5 - 5.2 g/dL   AST 26  0 - 37 U/L   ALT 22  0 - 53 U/L   Alkaline Phosphatase 82  39 - 117 U/L   Total Bilirubin 0.5  0.3 - 1.2 mg/dL   GFR calc non Af Amer 43 (*) >90 mL/min   GFR calc Af Amer 50 (*) >90 mL/min   Comment: (NOTE)     The eGFR has been calculated using the CKD EPI equation.     This calculation has not been validated in all clinical situations.     eGFR's persistently <90 mL/min signify possible Chronic Kidney     Disease.   Anion gap 15  5 - 15  CBG MONITORING, ED     Status: Abnormal   Collection Time    04/03/14  8:53 PM      Result Value Ref Range   Glucose-Capillary 128 (*) 70 - 99 mg/dL  POC OCCULT BLOOD, ED     Status: Abnormal   Collection Time    04/03/14  8:58 PM      Result Value Ref Range   Fecal Occult Bld POSITIVE (*) NEGATIVE   No results found.  Review of Systems  Constitutional: Negative for fever and chills.  HENT: Negative for hearing loss.   Eyes: Negative for double vision and photophobia.  Respiratory: Negative for cough, hemoptysis, sputum production and shortness of breath.   Cardiovascular: Negative for chest pain, palpitations, orthopnea and leg swelling.  Gastrointestinal: Positive for abdominal pain. Negative for nausea, vomiting, diarrhea and constipation.  Genitourinary: Negative for dysuria and urgency.  Musculoskeletal: Negative for back pain and neck pain.  Skin: Negative for rash.  Neurological: Positive for dizziness. Negative for tingling, tremors and headaches.    Blood pressure 132/57, pulse 69, temperature 98.4 F (36.9 C), temperature source Oral, resp. rate 19, SpO2 100.00%. Physical Exam  Constitutional: He is oriented to person, place, and time.  Eyes: Left eye exhibits no discharge. No scleral icterus.  Conjunctiva pale sclera nonicteric  Neck: Normal range of motion. Neck supple. No JVD present. No tracheal deviation present. No thyromegaly present.  Cardiovascular:  Normal sinus rhythm S1 and S2 soft 3/6  systolic murmur noted  Respiratory: Effort normal and breath sounds normal. No respiratory distress. He has no wheezes. He has no rales.  GI: Soft. Bowel sounds are normal. He exhibits no distension. There is tenderness. There is no rebound and no guarding.  Musculoskeletal: He  exhibits no edema and no tenderness.  Neurological: He is alert and oriented to person, place, and time.     Assessment/Plan Abdominal pain associated with heme-positive stool rule out upper GI bleed Orthostatic hypotension probably secondary to above rule out due to medications History of severe take stenosis status post aVR in the past Coronary artery disease Hypertension Diabetes mellitus GERD History of recurrent upper GI bleed secondary to antral DPM/gastritis History of colonic polyps Hypercholesteremia Chronic anemia Plan As per orders Check serial H&H May need GI consult significant drop in hemoglobin Continue PPIs and Carafate   Paxton Binns N 04/03/2014, 10:27 PM

## 2014-04-03 NOTE — ED Notes (Signed)
MD at bedside. 

## 2014-04-03 NOTE — ED Provider Notes (Signed)
80 year old male noted that he was feeling weak today and he felt lightheaded when he stood up. He denies any chest pain, nausea, vomiting. On exam, lungs are clear and heart has regular rate rhythm. Abdomen is soft and nontender. He was noted to have orthostatic drop in blood pressure.  hemoglobin is stable although stool is Hemoccult positive. He is admitted for further evaluation.  I saw and evaluated the patient, reviewed the resident's note and I agree with the findings and plan.   EKG Interpretation   Date/Time:  Thursday April 03 2014 20:41:13 EDT Ventricular Rate:  70 PR Interval:  156 QRS Duration: 68 QT Interval:  406 QTC Calculation: 438 R Axis:   30 Text Interpretation:  Sinus rhythm Repol abnrm suggests ischemia,  anterolateral Septal infarct , age undetermined When compared with ECG of  03/21/2014, T wave inversion in the Inferior leads is no longer Present  Confirmed by [facility]  MD, Selestino Nila ([id number]) on 04/03/2014 9:46:39 PM        Delora Fuel, MD 23/53/61 [id number]

## 2014-04-03 NOTE — ED Notes (Signed)
Pt c/o dizziness when attempting to ambulate. Pt reports being here recently and receiving a blood transfusion due to hemoglobin being low. Pt alert and oriented, denies any pain at this time.

## 2014-04-04 ENCOUNTER — Encounter (HOSPITAL_COMMUNITY): Payer: Self-pay | Admitting: Physician Assistant

## 2014-04-04 DIAGNOSIS — R195 Other fecal abnormalities: Secondary | ICD-10-CM

## 2014-04-04 DIAGNOSIS — D509 Iron deficiency anemia, unspecified: Secondary | ICD-10-CM

## 2014-04-04 LAB — URINALYSIS, ROUTINE W REFLEX MICROSCOPIC
BILIRUBIN URINE: NEGATIVE
Glucose, UA: NEGATIVE mg/dL
Hgb urine dipstick: NEGATIVE
Ketones, ur: NEGATIVE mg/dL
Leukocytes, UA: NEGATIVE
Nitrite: NEGATIVE
PH: 6 (ref 5.0–8.0)
Protein, ur: NEGATIVE mg/dL
Specific Gravity, Urine: 1.013 (ref 1.005–1.030)
Urobilinogen, UA: 0.2 mg/dL (ref 0.0–1.0)

## 2014-04-04 LAB — CBC
HEMATOCRIT: 25.2 % — AB (ref 39.0–52.0)
HEMOGLOBIN: 8.1 g/dL — AB (ref 13.0–17.0)
MCH: 22.8 pg — ABNORMAL LOW (ref 26.0–34.0)
MCHC: 32.1 g/dL (ref 30.0–36.0)
MCV: 70.8 fL — ABNORMAL LOW (ref 78.0–100.0)
Platelets: 319 10*3/uL (ref 150–400)
RBC: 3.56 MIL/uL — ABNORMAL LOW (ref 4.22–5.81)
RDW: 17.5 % — ABNORMAL HIGH (ref 11.5–15.5)
WBC: 11.5 10*3/uL — AB (ref 4.0–10.5)

## 2014-04-04 LAB — BASIC METABOLIC PANEL
ANION GAP: 10 (ref 5–15)
BUN: 17 mg/dL (ref 6–23)
CALCIUM: 8.9 mg/dL (ref 8.4–10.5)
CHLORIDE: 105 meq/L (ref 96–112)
CO2: 24 mEq/L (ref 19–32)
Creatinine, Ser: 1.29 mg/dL (ref 0.50–1.35)
GFR calc Af Amer: 60 mL/min — ABNORMAL LOW (ref >90)
GFR, EST NON AFRICAN AMERICAN: 51 mL/min — AB (ref >90)
Glucose, Bld: 108 mg/dL — ABNORMAL HIGH (ref 70–99)
Potassium: 4.2 mEq/L (ref 3.7–5.3)
Sodium: 139 mEq/L (ref 137–147)

## 2014-04-04 LAB — GLUCOSE, CAPILLARY: Glucose-Capillary: 93 mg/dL (ref 70–99)

## 2014-04-04 LAB — HEMOGLOBIN A1C
HEMOGLOBIN A1C: 6.1 % — AB (ref <5.7)
Mean Plasma Glucose: 128 mg/dL — ABNORMAL HIGH (ref <117)

## 2014-04-04 NOTE — Progress Notes (Signed)
Subjective:  Patient denies any chest pain or shortness of breath. Denies abdominal pain. States had no BM so far. States overall feels better Objective:  Vital Signs in the last 24 hours: Temp:  [98.2 F (36.8 C)-98.4 F (36.9 C)] 98.2 F (36.8 C) (07/03 0558) Pulse Rate:  [59-73] 64 (07/03 0558) Resp:  [15-19] 18 (07/03 0558) BP: (83-138)/(43-61) 136/61 mmHg (07/03 0558) SpO2:  [98 %-100 %] 100 % (07/03 0558) Weight:  [82.645 kg (182 lb 3.2 oz)] 82.645 kg (182 lb 3.2 oz) (07/02 2335)  Intake/Output from previous day: 07/02 0701 - 07/03 0700 In: -  Out: 750 [Urine:750] Intake/Output from this shift: Total I/O In: 240 [P.O.:240] Out: -   Physical Exam: Neck: no adenopathy, no carotid bruit, no JVD and supple, symmetrical, trachea midline Lungs: clear to auscultation bilaterally Heart: regular rate and rhythm, S1, S2 normal and 2/6 systolic murmur noted Abdomen: soft, non-tender; bowel sounds normal; no masses,  no organomegaly Extremities: extremities normal, atraumatic, no cyanosis or edema  Lab Results:  Recent Labs  04/03/14 1939 04/04/14 0151  WBC 9.5 11.5*  HGB 9.4* 8.1*  PLT 411* 319    Recent Labs  04/03/14 1939 04/04/14 0151  NA 137 139  K 4.2 4.2  CL 100 105  CO2 22 24  GLUCOSE 161* 108*  BUN 17 17  CREATININE 1.50* 1.29   No results found for this basename: TROPONINI, CK, MB,  in the last 72 hours Hepatic Function Panel  Recent Labs  04/03/14 1939  PROT 7.9  ALBUMIN 3.6  AST 26  ALT 22  ALKPHOS 82  BILITOT 0.5   No results found for this basename: CHOL,  in the last 72 hours No results found for this basename: PROTIME,  in the last 72 hours  Imaging: Imaging results have been reviewed and No results found.  Cardiac Studies:  Assessment/Plan:  Status post Abdominal pain associated with heme-positive stool rule out upper GI bleed  Acute on chronic anemia partly due to hydration rule out chronic GI loss Orthostatic hypotension  probably secondary to above rule out due to medications  History of severe take stenosis status post aVR in the past  Coronary artery disease  Hypertension  Diabetes mellitus  GERD  History of recurrent upper GI bleed secondary to antral DPM/gastritis  History of colonic polyps  Hypercholesteremia  Chronic anemia Plan GI consult called  Monitor H&H Dr. Doylene Canard on-call for weekend  LOS: 1 day    [facility] N 04/04/2014, 9:38 AM

## 2014-04-04 NOTE — Progress Notes (Signed)
[facility] Consult: Dr Henrene Pastor covering for Dr Collene Mares  11:16 AM 04/04/2014  LOS: 1 day    Referring Provider: Dr Terrence Dupont Primary Care Physician:  Clent Demark, MD Primary Gastroenterologist:  Dr. Collene Mares   Reason for Consultation:  GI bleed   HPI: [name] is a 80 y.o. male.  PMH coronary artery disease history of severe aortic stenosis status post AVR, hypertension, DM2, GERD, colon polyp. Recurrent GIB and blood loss anemia.  09/2013 EGD with ablation of oozing gastic AVMs. EGD 6/19 at time of  6/17 - 6/21 admission with acute coronary syndrome.  EGD for melena, anemia (Hgb 7.9) requiring one unit PRBC:  Proximal gastritis. Hgb 9.0 at discharge.  BID Protonix added to his chronic QID Carafate. Takes po iron BID. Iron level in 09/2013 was low at 32 and ferritin low at 17.   Coumadin discontinued for a long time but not clear exactly when.  Pt not on ASA or using NSAIDs.    Returned to ED for 2 day hx of weakness, dizziness.  Hgb initially 9.4, but today it is 8.1.   On exam is FOBT + (chronic).  BP as low as 80s/ 40s, improved post IVF bolus. Pulse never tashycardic.  Not having melena or bloody stool.  Stools are daily, brown.  Compliant with PPI and Carafate.  No GI upset,  No nausea.   Last Colonoscopy was in 2013 (per Dr Ulyses Amor 09/2013 consult note).  Not clear of findings on this or on 2013 capsule endoscopy.     Past Medical History  Diagnosis Date  . Heart valve disease   . Coronary artery disease   . Hypertension   . GERD (gastroesophageal reflux disease)   . Diabetes mellitus without complication   . Aortic stenosis, severe   . S/P AVR (aortic valve replacement)     on chronic coagulation  . History of colonic polyps     Hyperplastic and adenomatous in 2004.  . GI bleed     hx of    . Shortness of breath     WITH ACTIVITY  . Heart murmur     Past Surgical History  Procedure Laterality Date  . Esophagogastroduodenoscopy  09/11/2012    Procedure: ESOPHAGOGASTRODUODENOSCOPY (EGD);  Surgeon: Beryle Beams, MD;  Location: [facility] ENDOSCOPY;  Service: Endoscopy;  Laterality: N/A;  . Aortic valve replacement  2000  . Cholecystectomy  2009  . Esophagogastroduodenoscopy N/A 09/17/2013    Procedure: ESOPHAGOGASTRODUODENOSCOPY (EGD);  Surgeon: Beryle Beams, MD;  Location:  [place] ENDOSCOPY;  Service: Endoscopy;  Laterality: N/A;  . Esophagogastroduodenoscopy N/A 03/21/2014    Procedure: ESOPHAGOGASTRODUODENOSCOPY (EGD);  Surgeon: Beryle Beams, MD;  Location: [facility] ENDOSCOPY;  Service: Endoscopy;  Laterality: N/A;    Prior to Admission medications   Medication Sig Start Date End Date Taking? Authorizing Provider  amLODipine (NORVASC) 2.5 MG tablet Take 2.5 mg by mouth daily.    Yes Historical Provider, MD  atorvastatin (LIPITOR) 40 MG tablet Take 1 tablet by mouth daily. 08/05/13  Yes Historical Provider, MD  bimatoprost (LUMIGAN) 0.01 % SOLN Place 1 drop into both eyes at bedtime.   Yes Historical Provider, MD  carvedilol (COREG) 3.125 MG tablet Take 1 tablet (3.125 mg total) by mouth 2 (two) times daily with a meal. 03/23/14  Yes Birdie Riddle, MD  finasteride (PROSCAR) 5 MG tablet Take 5 mg by mouth daily.   Yes Historical Provider, MD  meclizine (ANTIVERT) 12.5 MG tablet Take 1 tablet (12.5 mg total) by mouth 3 (three) times daily as needed for dizziness. 11/30/12  Yes Kalman Drape, MD  quinapril (ACCUPRIL) 20 MG tablet Take 20 mg by mouth 2 (two) times daily.   Yes Historical Provider, MD  sucralfate (CARAFATE) 1 GM/10ML suspension Take 1 g by mouth 4 (four) times daily.   Yes Historical Provider, MD    Scheduled Meds: . atorvastatin  40 mg Oral Daily  . carvedilol  3.125 mg Oral BID WC  . finasteride  5 mg Oral Daily  . insulin aspart  0-9 Units Subcutaneous TID WC  .  pantoprazole  40 mg Oral BID  . senna  1 tablet Oral BID  . sucralfate  1 g Oral QID   Infusions: . sodium chloride 1,000 mL (04/03/14 2043)  . sodium chloride 50 mL/hr at 04/04/14 0625   PRN Meds: meclizine   Allergies as of 04/03/2014  . (No Known Allergies)    No family history on file.  History   Social History  . Marital Status: Widowed twice.     Spouse Name: N/A    Number of Children: N/A  . Years of Education: N/A   Occupational History  . Not on file.   Social History Main Topics  . Smoking status: Former Research scientist (life sciences)  . Smokeless tobacco: Never Used     Comment: QUIT SMOKING BACK IN THE 90"S  . Alcohol Use: None  . Drug Use: No  . Sexual Activity: Not on file    Social History Narrative  . Daughter attentive, lives 5 minutes away from pt.     REVIEW OF SYSTEMS: Constitutional:  Weight was 191 in last admission, now 182 ENT:  No nose bleeds Pulm:  No SOB or cough CV:  No palpitations, no LE edema. No chest pain. GU:  No hematuria, no frequency GI:  No dysphagia, no constipation Heme:  Per HPI   Transfusions:  Per HPI Neuro:  No headaches, toes on left are numb.  Derm:  No itching, no rash or sores.  Endocrine:  No sweats or chills.  No polyuria or dysuria Immunization:  Not queeried Travel:  None beyond local counties in last few months.    PHYSICAL EXAM: Vital signs in last 24 hours: Filed Vitals:   04/04/14 0558  BP: 136/61  Pulse: 64  Temp: 98.2 F (36.8 C)  Resp: 18   Wt Readings from Last 3 Encounters:  04/03/14 82.645 kg (182 lb 3.2 oz)  03/23/14 83.008 kg (183 lb)  03/23/14 83.008 kg (183  lb)   General: very pleasant, elderly but well appearing AAM.  Comfortable.  Head:  No asymmetry or swelling  Eyes:  No pallor or icterus Ears:  Not HOH  Nose:  No congestion or discharge Mouth:  No sores or blood Neck:  No JVD or mass.  No TMG Lungs:  Clear bil.  No dyspnea Heart: RRR with soft murmer.  Abdomen:  Soft, NT, ND.  BS active.   No mass or HSM.   Rectal: orange brown soft stool is FOBT+   Musc/Skeltl: no joint swelling or deformity Extremities:  No CCE  Neurologic:  Oriented x 3.  No limb weakness.  Fully alert and engaged Skin:  No rash, sores or vascular ectasias.  Tattoos:  none Nodes:  No cervical adenopathy.    Psych:  Pleasant, cooperative, fluid speech.  Fair historian.  Relaxed  Intake/Output from previous day: 07/02 0701 - 07/03 0700 In: -  Out: 750 [Urine:750] Intake/Output this shift: Total I/O In: 240 [P.O.:240] Out: -   LAB RESULTS:  Recent Labs  04/03/14 1939 04/04/14 0151  WBC 9.5 11.5*  HGB 9.4* 8.1*  HCT 29.6* 25.2*  PLT 411* 319   BMET Lab Results  Component Value Date   NA 139 04/04/2014   NA 137 04/03/2014   NA 140 03/20/2014   K 4.2 04/04/2014   K 4.2 04/03/2014   K 3.8 03/20/2014   CL 105 04/04/2014   CL 100 04/03/2014   CL 106 03/20/2014   CO2 24 04/04/2014   CO2 22 04/03/2014   CO2 23 03/20/2014   GLUCOSE 108* 04/04/2014   GLUCOSE 161* 04/03/2014   GLUCOSE 102* 03/20/2014   BUN 17 04/04/2014   BUN 17 04/03/2014   BUN 14 03/20/2014   CREATININE 1.29 04/04/2014   CREATININE 1.50* 04/03/2014   CREATININE 1.24 03/20/2014   CALCIUM 8.9 04/04/2014   CALCIUM 9.5 04/03/2014   CALCIUM 9.4 03/20/2014   LFT  Recent Labs  04/03/14 1939  PROT 7.9  ALBUMIN 3.6  AST 26  ALT 22  ALKPHOS 82  BILITOT 0.5   PT/INR Lab Results  Component Value Date   INR 1.01 03/20/2014   INR 1.07 09/19/2013   INR 1.15 09/18/2013    RADIOLOGY STUDIES: No results found.  ENDOSCOPIC STUDIES: 03/21/14  EGD Mild gastritis.  09/17/13 EGD  IMPRESSION:  1) Bleeding proximal gastric AVMs.  2) Healing small gastric erosions.  09/11/12 Enteroscopy Normal enteroscopy.  12/2011  EGD vs enteroscopy Normal study.   10/2002  Colonoscopy  1. Colonic polyps, removed from the left colon and the rectosigmoid area by  snare polypectomy.  2. Prominent internal hemorrhoids.  3. No evidence of diverticulosis.  4.  Normal-appearing transverse colon, right colon, and cecum. pathology: Polyps: HP and adenomatous.   IMPRESSION:   *  Recurrent GI bleed. Ablation of gastric AVMs 09/2013.  Gastritis on 03/2014 EGD.  Chronic QID Carafate and BID Protonix for last 3 weeks.   *  Recurrent anemia.  Current measures not far from recent baseline.  Suspect a lot of the drop is due to bolus and ongong IVF.  On chronic iron for hx IDA 09/2013.   *  S/p AVR 2000 not on Coumadin. Mild gastritis 03/21/2014.  Doubt gastritis is cause of anemia.   *  Hx colon polyps.  Apparently had follow up study in 2013, do not have access to those records.    PLAN:     *  Could Dr Terrence Dupont search his office  records to try to determine year of latest colonoscopy, assuming report was sent to him?   We may need to repeat the colonoscopy vs enteroscopy to look for AVMs vs capsule endoscopy.  *  Dr Henrene Pastor will follow pt for Dr Collene Mares over the holiday weekend.  *  Ideally should be taking Carafate 2 hours apart from all other po meds.  Not clear it is conferring any benefit for pt with likely bleeding from AVMs. *  Consider outpt hematology evaluation.  *  Allow pt to eat.     Azucena Freed  04/04/2014, 11:16 AM Pager: [phone]  GI ATTENDING  History, x-rays, laboratories reviewed. Prior endoscopic evaluations reviewed. Patient personally seen and examined. Agree with H&P as above. We are asked to see the patient for anemia and Hemoccult-positive stool. This is a chronic problem. He was evaluated one week ago by Dr. Benson Norway regarding the same. Upper endoscopy was unrevealing. Prior upper endoscopy in one occasion revealed AVM which was treated endoscopically. Colonoscopy and capsule endoscopy without significant abnormalities. Despite his dizziness and low blood pressure, he has not had melena or hematochezia. His hemoglobin is about a baseline on admission. Slightly lower after hydration due to dilutional effect. Stools are yellow orange.  Occult positive confirmed.  IMPRESSION 1. Chronic microcytic anemia, likely iron deficiency. Persistent despite chronic oral iron therapy 2. Heme-positive stool. Prior extensive GI evaluations as outlined. Most recent EGD 1 week ago. Not clinically significant GI bleeding (melena or hematochezia). 3. Dizziness and low blood pressure. This is not explained by GI bleeding 4. General medical problems  RECOMMENDATIONS 1. Hematology consultation. This to assure that there are not other contributing causes for his chronic anemia. As well, if only iron deficiency, may need IV iron infusion. I would leave this decision to hematology. 2. Transfuse as clinically indicated for symptoms 3. No indication for endoscopic evaluation short of acute bleeding.  Thank you for the opportunity to see this nice patient. He will resume GI care with Dr. Collene Mares as needed. Will sign off  Blia Totman N. Geri Seminole., M.D. [facility] of G[facility]enterology

## 2014-04-05 LAB — CBC
HCT: 24.3 % — ABNORMAL LOW (ref 39.0–52.0)
Hemoglobin: 7.9 g/dL — ABNORMAL LOW (ref 13.0–17.0)
MCH: 23 pg — AB (ref 26.0–34.0)
MCHC: 32.5 g/dL (ref 30.0–36.0)
MCV: 70.8 fL — ABNORMAL LOW (ref 78.0–100.0)
PLATELETS: 320 10*3/uL (ref 150–400)
RBC: 3.43 MIL/uL — ABNORMAL LOW (ref 4.22–5.81)
RDW: 17.5 % — AB (ref 11.5–15.5)
WBC: 8.2 10*3/uL (ref 4.0–10.5)

## 2014-04-05 LAB — BASIC METABOLIC PANEL
ANION GAP: 10 (ref 5–15)
BUN: 13 mg/dL (ref 6–23)
CALCIUM: 8.9 mg/dL (ref 8.4–10.5)
CO2: 25 mEq/L (ref 19–32)
CREATININE: 1.1 mg/dL (ref 0.50–1.35)
Chloride: 105 mEq/L (ref 96–112)
GFR calc non Af Amer: 62 mL/min — ABNORMAL LOW (ref >90)
GFR, EST AFRICAN AMERICAN: 72 mL/min — AB (ref >90)
Glucose, Bld: 98 mg/dL (ref 70–99)
Potassium: 4.1 mEq/L (ref 3.7–5.3)
Sodium: 140 mEq/L (ref 137–147)

## 2014-04-05 LAB — GLUCOSE, CAPILLARY
Glucose-Capillary: 99 mg/dL (ref 70–99)
Glucose-Capillary: 91 mg/dL (ref 70–99)
Glucose-Capillary: 112 mg/dL — ABNORMAL HIGH (ref 70–99)

## 2014-04-05 NOTE — Progress Notes (Signed)
Ref: Clent Demark, MD   Subjective:  Awaiting Hematology consult. No new complaints. Low Hgb of 7.9. Afebrile.  Objective:  Vital Signs in the last 24 hours: Temp:  [97.7 F (36.5 C)-98.5 F (36.9 C)] 98.5 F (36.9 C) (07/04 0400) Pulse Rate:  [60-63] 61 (07/04 0400) Cardiac Rhythm:  [-] Sinus bradycardia (07/04 0859) Resp:  [18] 18 (07/04 0400) BP: (120-141)/(56-67) 134/58 mmHg (07/04 0400) SpO2:  [98 %-100 %] 98 % (07/04 0400)  Physical Exam: BP Readings from Last 1 Encounters:  04/05/14 134/58    Wt Readings from Last 1 Encounters:  04/03/14 82.645 kg (182 lb 3.2 oz)    Weight change:   HEENT: [place]/AT, Eyes-Brown, PERL, EOMI, Conjunctiva-Pale, Sclera-Non-icteric. Wears glasses. Neck: No JVD, No bruit, Trachea midline. Lungs:  Clear, Bilateral. Cardiac:  Regular rhythm, normal S1 and S2, no S3. II/VI systolic murmur. Abdomen:  Soft, non-tender. Extremities:  No edema present. No cyanosis. No clubbing. CNS: AxOx3, Cranial nerves grossly intact, moves all 4 extremities. Right handed. Skin: Warm and dry.   Intake/Output from previous day: 07/03 0701 - 07/04 0700 In: 600 [P.O.:600] Out: 2300 [Urine:2300]    Lab Results: BMET    Component Value Date/Time   NA 140 04/05/2014 0333   NA 139 04/04/2014 0151   NA 137 04/03/2014 1939   K 4.1 04/05/2014 0333   K 4.2 04/04/2014 0151   K 4.2 04/03/2014 1939   CL 105 04/05/2014 0333   CL 105 04/04/2014 0151   CL 100 04/03/2014 1939   CO2 25 04/05/2014 0333   CO2 24 04/04/2014 0151   CO2 22 04/03/2014 1939   GLUCOSE 98 04/05/2014 0333   GLUCOSE 108* 04/04/2014 0151   GLUCOSE 161* 04/03/2014 1939   BUN 13 04/05/2014 0333   BUN 17 04/04/2014 0151   BUN 17 04/03/2014 1939   CREATININE 1.10 04/05/2014 0333   CREATININE 1.29 04/04/2014 0151   CREATININE 1.50* 04/03/2014 1939   CALCIUM 8.9 04/05/2014 0333   CALCIUM 8.9 04/04/2014 0151   CALCIUM 9.5 04/03/2014 1939   GFRNONAA 62* 04/05/2014 0333   GFRNONAA 51* 04/04/2014 0151   GFRNONAA 43* 04/03/2014 1939   GFRAA 72*  04/05/2014 0333   GFRAA 60* 04/04/2014 0151   GFRAA 50* 04/03/2014 1939   CBC    Component Value Date/Time   WBC 8.2 04/05/2014 0333   RBC 3.43* 04/05/2014 0333   HGB 7.9* 04/05/2014 0333   HCT 24.3* 04/05/2014 0333   PLT 320 04/05/2014 0333   MCV 70.8* 04/05/2014 0333   MCH 23.0* 04/05/2014 0333   MCHC 32.5 04/05/2014 0333   RDW 17.5* 04/05/2014 0333   LYMPHSABS 2.3 04/03/2014 1939   MONOABS 0.7 04/03/2014 1939   EOSABS 0.2 04/03/2014 1939   BASOSABS 0.1 04/03/2014 1939   HEPATIC Function Panel  Recent Labs  09/16/13 0800 03/19/14 2213 04/03/14 1939  PROT 6.6 7.5 7.9   HEMOGLOBIN A1C No components found with this basename: HGA1C,  MPG   CARDIAC ENZYMES Lab Results  Component Value Date   CKTOTAL 70 11/24/2010   CKMB 1.5 11/24/2010   TROPONINI <0.30 03/20/2014   TROPONINI <0.30 03/20/2014   TROPONINI <0.30 03/20/2014   BNP  Recent Labs  09/13/13 0640 03/19/14 2331  PROBNP 137.2 533.4*   TSH  Recent Labs  09/13/13 1320  TSH 0.641   CHOLESTEROL  Recent Labs  03/20/14 0800  CHOL 125    Scheduled Meds: . atorvastatin  40 mg Oral Daily  . carvedilol  3.125 mg  Oral BID WC  . finasteride  5 mg Oral Daily  . insulin aspart  0-9 Units Subcutaneous TID WC  . pantoprazole  40 mg Oral BID  . senna  1 tablet Oral BID  . sucralfate  1 g Oral QID   Continuous Infusions: . sodium chloride 1,000 mL (04/03/14 2043)  . sodium chloride 50 mL/hr at 04/05/14 0117   PRN Meds:.meclizine  Assessment/Plan: Status post Abdominal pain associated with heme-positive stool rule out upper GI bleed  Acute on chronic anemia partly due to hydration rule out chronic GI loss  Orthostatic hypotension probably secondary to above rule out due to medications  History of severe take stenosis status post aVR in the past  Coronary artery disease  Hypertension  Diabetes mellitus  GERD  History of recurrent upper GI bleed secondary to antral DPM/gastritis  History of colonic polyps  Hypercholesteremia   Chronic anemia  Continue medical treatment.    LOS: 2 days    Dixie Dials  MD  04/05/2014, 11:49 AM

## 2014-04-06 LAB — GLUCOSE, CAPILLARY
GLUCOSE-CAPILLARY: 103 mg/dL — AB (ref 70–99)
Glucose-Capillary: 105 mg/dL — ABNORMAL HIGH (ref 70–99)
Glucose-Capillary: 97 mg/dL (ref 70–99)

## 2014-04-06 NOTE — Progress Notes (Signed)
Ref: Clent Demark, MD   Subjective:  Discussed care with Dr. Sherrill/Hematology. He prefers OP follow up post iron treatment. Afebrile. Not ambulating much.  Objective:  Vital Signs in the last 24 hours: Temp:  [98.1 F (36.7 C)-98.3 F (36.8 C)] 98.2 F (36.8 C) (07/05 0409) Pulse Rate:  [57-62] 57 (07/05 0409) Cardiac Rhythm:  [-] Normal sinus rhythm;Sinus bradycardia (07/05 0826) Resp:  [18] 18 (07/05 0409) BP: (121-134)/(44-62) 123/44 mmHg (07/05 0409) SpO2:  [99 %-100 %] 99 % (07/05 0409)  Physical Exam: BP Readings from Last 1 Encounters:  04/06/14 123/44    Wt Readings from Last 1 Encounters:  04/03/14 82.645 kg (182 lb 3.2 oz)    Weight change:   HEENT: [place]/AT, Eyes-Brown, wears glasses, PERL, EOMI, Conjunctiva-Pale, Sclera-Non-icteric Neck: No JVD, No bruit, Trachea midline. Lungs:  Clear, Bilateral. Cardiac:  Regular rhythm, normal S1 and S2, no S3. II/VI systolic murmur. Abdomen:  Soft, non-tender. Extremities:  No edema present. No cyanosis. No clubbing. CNS: AxOx3, Cranial nerves grossly intact, moves all 4 extremities. Right handed. Skin: Warm and dry.   Intake/Output from previous day: 07/04 0701 - 07/05 0700 In: 960 [P.O.:960] Out: 1810 [Urine:1810]    Lab Results: BMET    Component Value Date/Time   NA 140 04/05/2014 0333   NA 139 04/04/2014 0151   NA 137 04/03/2014 1939   K 4.1 04/05/2014 0333   K 4.2 04/04/2014 0151   K 4.2 04/03/2014 1939   CL 105 04/05/2014 0333   CL 105 04/04/2014 0151   CL 100 04/03/2014 1939   CO2 25 04/05/2014 0333   CO2 24 04/04/2014 0151   CO2 22 04/03/2014 1939   GLUCOSE 98 04/05/2014 0333   GLUCOSE 108* 04/04/2014 0151   GLUCOSE 161* 04/03/2014 1939   BUN 13 04/05/2014 0333   BUN 17 04/04/2014 0151   BUN 17 04/03/2014 1939   CREATININE 1.10 04/05/2014 0333   CREATININE 1.29 04/04/2014 0151   CREATININE 1.50* 04/03/2014 1939   CALCIUM 8.9 04/05/2014 0333   CALCIUM 8.9 04/04/2014 0151   CALCIUM 9.5 04/03/2014 1939   GFRNONAA 62* 04/05/2014 0333   GFRNONAA 51* 04/04/2014 0151   GFRNONAA 43* 04/03/2014 1939   GFRAA 72* 04/05/2014 0333   GFRAA 60* 04/04/2014 0151   GFRAA 50* 04/03/2014 1939   CBC    Component Value Date/Time   WBC 8.2 04/05/2014 0333   RBC 3.43* 04/05/2014 0333   HGB 7.9* 04/05/2014 0333   HCT 24.3* 04/05/2014 0333   PLT 320 04/05/2014 0333   MCV 70.8* 04/05/2014 0333   MCH 23.0* 04/05/2014 0333   MCHC 32.5 04/05/2014 0333   RDW 17.5* 04/05/2014 0333   LYMPHSABS 2.3 04/03/2014 1939   MONOABS 0.7 04/03/2014 1939   EOSABS 0.2 04/03/2014 1939   BASOSABS 0.1 04/03/2014 1939   HEPATIC Function Panel  Recent Labs  09/16/13 0800 03/19/14 2213 04/03/14 1939  PROT 6.6 7.5 7.9   HEMOGLOBIN A1C No components found with this basename: HGA1C,  MPG   CARDIAC ENZYMES Lab Results  Component Value Date   CKTOTAL 70 11/24/2010   CKMB 1.5 11/24/2010   TROPONINI <0.30 03/20/2014   TROPONINI <0.30 03/20/2014   TROPONINI <0.30 03/20/2014   BNP  Recent Labs  09/13/13 0640 03/19/14 2331  PROBNP 137.2 533.4*   TSH  Recent Labs  09/13/13 1320  TSH 0.641   CHOLESTEROL  Recent Labs  03/20/14 0800  CHOL 125    Scheduled Meds: . atorvastatin  40 mg Oral  Daily  . carvedilol  3.125 mg Oral BID WC  . finasteride  5 mg Oral Daily  . insulin aspart  0-9 Units Subcutaneous TID WC  . pantoprazole  40 mg Oral BID  . senna  1 tablet Oral BID  . sucralfate  1 g Oral QID   Continuous Infusions: . sodium chloride 1,000 mL (04/03/14 2043)  . sodium chloride 50 mL/hr at 04/05/14 2218   PRN Meds:.meclizine  Assessment/Plan: Status post Abdominal pain associated with heme-positive stool rule out upper GI bleed  Acute on chronic anemia partly due to hydration and chronic GI loss and iron deficiency  Orthostatic hypotension probably secondary to above  History of severe aortic stenosis  Status post aVR in the past  Coronary artery disease  Hypertension  Diabetes mellitus, II  GERD  History of recurrent upper GI bleed secondary to antral  DPM/gastritis  History of colonic polyps  Hypercholesteremia   Increase activity.   LOS: 3 days    Dixie Dials  MD  04/06/2014, 10:08 AM

## 2014-04-06 NOTE — Progress Notes (Signed)
Pt ambulated 75 feet at this time; steady gait noted; no needs voiced; pt back to room to bed; will cont. To monitor.

## 2014-04-06 NOTE — Progress Notes (Signed)
Attempted to ambulated with pt for second time; pt not wanting to ambulate at this time; pt states he feels weak and doesn't feel he is ready to d/c home today; will cont. To monitor.

## 2014-04-07 LAB — CBC
HEMATOCRIT: 26.2 % — AB (ref 39.0–52.0)
Hemoglobin: 8.3 g/dL — ABNORMAL LOW (ref 13.0–17.0)
MCH: 22.3 pg — AB (ref 26.0–34.0)
MCHC: 31.7 g/dL (ref 30.0–36.0)
MCV: 70.2 fL — ABNORMAL LOW (ref 78.0–100.0)
Platelets: 341 10*3/uL (ref 150–400)
RBC: 3.73 MIL/uL — ABNORMAL LOW (ref 4.22–5.81)
RDW: 17.6 % — ABNORMAL HIGH (ref 11.5–15.5)
WBC: 9.9 10*3/uL (ref 4.0–10.5)

## 2014-04-07 LAB — GLUCOSE, CAPILLARY
Glucose-Capillary: 132 mg/dL — ABNORMAL HIGH (ref 70–99)
Glucose-Capillary: 116 mg/dL — ABNORMAL HIGH (ref 70–99)
Glucose-Capillary: 108 mg/dL — ABNORMAL HIGH (ref 70–99)

## 2014-04-07 MED ORDER — FERROUS GLUCONATE 324 (38 FE) MG PO TABS: 324.0000 mg | ORAL_TABLET | Freq: Every day | ORAL | Status: AC

## 2014-04-07 MED ORDER — QUINAPRIL HCL 20 MG PO TABS: 20.0000 mg | ORAL_TABLET | Freq: Every day | ORAL | Status: AC

## 2014-04-07 MED ORDER — PANTOPRAZOLE SODIUM 40 MG PO TBEC: 40.0000 mg | DELAYED_RELEASE_TABLET | Freq: Two times a day (BID) | ORAL | Status: AC

## 2014-04-07 NOTE — Discharge Summary (Signed)
  Discharge summary dictated on 04/07/2014 dictation number is [id number]

## 2014-04-07 NOTE — Care Management Note (Signed)
    Page 1 of 1   04/07/2014     2:49:26 PM CARE MANAGEMENT NOTE 04/07/2014  Patient:  Serfass,Jamon   Account Number:  [id number]  Date Initiated:  04/07/2014  Documentation initiated by:  Ogechi Kuehnel  Subjective/Objective Assessment:   Pt adm on 04/03/14 with abd pain, GI bleed.  PTA, pt independent, lives alone.     Action/Plan:   Pt denies any dc needs.   Anticipated DC Date:  04/07/2014   Anticipated DC Plan:  [place]  CM consult      Choice offered to / List presented to:             Status of service:  Completed, signed off Medicare Important Message given?  YES (If response is "NO", the following Medicare IM given date fields will be blank) Date Medicare IM given:  04/07/2014 Medicare IM given by:  Adeoluwa Silvers Date Additional Medicare IM given:   Additional Medicare IM given by:    Discharge Disposition:  HOME/SELF CARE  Per UR Regulation:  Reviewed for med. necessity/level of care/duration of stay  If discussed at [place] of Stay Meetings, dates discussed:    Comments:

## 2014-04-07 NOTE — Discharge Instructions (Signed)
Anemia, Nonspecific °Anemia is a condition in which the concentration of red blood cells or hemoglobin in the blood is below normal. Hemoglobin is a substance in red blood cells that carries oxygen to the tissues of the body. Anemia results in not enough oxygen reaching these tissues.  °CAUSES  °Common causes of anemia include:  °· Excessive bleeding. Bleeding may be internal or external. This includes excessive bleeding from periods (in women) or from the intestine.   °· Poor nutrition.   °· Chronic kidney, thyroid, and liver disease.  °· Bone marrow disorders that decrease red blood cell production. °· Cancer and treatments for cancer. °· HIV, AIDS, and their treatments. °· Spleen problems that increase red blood cell destruction. °· Blood disorders. °· Excess destruction of red blood cells due to infection, medicines, and autoimmune disorders. °SIGNS AND SYMPTOMS  °· Minor weakness.   °· Dizziness.   °· Headache. °· Palpitations.   °· Shortness of breath, especially with exercise.   °· Paleness. °· Cold sensitivity. °· Indigestion. °· Nausea. °· Difficulty sleeping. °· Difficulty concentrating. °Symptoms may occur suddenly or they may develop slowly.  °DIAGNOSIS  °Additional blood tests are often needed. These help your health care provider determine the best treatment. Your health care provider will check your stool for blood and look for other causes of blood loss.  °TREATMENT  °Treatment varies depending on the cause of the anemia. Treatment can include:  °· Supplements of iron, vitamin B12, or folic acid.   °· Hormone medicines.   °· A blood transfusion. This may be needed if blood loss is severe.   °· Hospitalization. This may be needed if there is significant continual blood loss.   °· Dietary changes. °· Spleen removal. °HOME CARE INSTRUCTIONS °Keep all follow-up appointments. It often takes many weeks to correct anemia, and having your health care provider check on your condition and your response to  treatment is very important. °SEEK IMMEDIATE MEDICAL CARE IF:  °· You develop extreme weakness, shortness of breath, or chest pain.   °· You become dizzy or have trouble concentrating. °· You develop heavy vaginal bleeding.   °· You develop a rash.   °· You have bloody or black, tarry stools.   °· You faint.   °· You vomit up blood.   °· You vomit repeatedly.   °· You have abdominal pain. °· You have a fever or persistent symptoms for more than 2-3 days.   °· You have a fever and your symptoms suddenly get worse.   °· You are dehydrated.   °MAKE SURE YOU: °· Understand these instructions. °· Will watch your condition. °· Will get help right away if you are not doing well or get worse. °Document Released: 10/27/2004 Document Revised: 05/22/2013 Document Reviewed: 03/15/2013 °ExitCare® Patient Information ©2015 [employer]. This information is not intended to replace advice given to you by your health care provider. Make sure you discuss any questions you have with your health care provider. ° °Bloody Stools °Bloody stools means there is blood in your poop (stool). It is a sign that there is a problem somewhere in the digestive system. It is important for your doctor to find the cause of your bleeding, so the problem can be treated.  °HOME CARE °· Only take medicine as told by your doctor. °· Eat foods with fiber (prunes, bran cereals). °· Drink enough fluids to keep your pee (urine) clear or pale yellow. °· Sit in warm water (sitz bath) for 10 to 15 minutes as told by your doctor. °· Know how to take your medicines (enemas, suppositories) if advised by your   doctor. °· Watch for signs that you are getting better or getting worse. °GET HELP RIGHT AWAY IF:  °· You are not getting better. °· You start to get better but then get worse again. °· You have new problems. °· You have severe bleeding from the place where poop comes out (rectum) that does not stop. °· You throw up (vomit) blood. °· You feel weak or pass out  (faint). °· You have a fever. °MAKE SURE YOU:  °· Understand these instructions. °· Will watch your condition. °· Will get help right away if you are not doing well or get worse. °Document Released: 09/07/2009 Document Revised: 12/12/2011 Document Reviewed: 02/04/2011 °Exit[employer]® Patient Information ©2015 [employer]. This information is not intended to replace advice given to you by your health care provider. Make sure you discuss any questions you have with your health care provider. ° °

## 2014-04-07 NOTE — Discharge Summary (Signed)
[name], [name] NO.:  [id number]  MEDICAL RECORD NO.:  [id number]  LOCATION:  2W16C                        FACILITY:  [facility]  PHYSICIAN:  Ruperto Kiernan N. Terrence Dupont, M.D. DATE OF BIRTH:  [birth year]  DATE OF ADMISSION:  04/03/2014 DATE OF DISCHARGE:  04/07/2014                              DISCHARGE SUMMARY   ADMITTING DIAGNOSES: 1. Abdominal pain associated with heme-positive stools, rule out upper     gastrointestinal bleed. 2. Orthostatic hypotension probably secondary to above, rule out due     to medications. 3. History of severe aortic stenosis status post AVR in the past. 4. Coronary artery disease. 5. Hypertension. 6. Diabetes mellitus. 7. Gastroesophageal reflux disease. 8. History of recurrent upper gastrointestinal bleeding secondary to     arteriovenous malformation/gastritis. 9. History of colonic polyps. 10.Hypercholesteremia. 11.Chronic anemia.  DISCHARGE DIAGNOSES: 1. Status post abdominal pain associated with heme-positive stools,     probably chronic lower gastrointestinal bleed, hypochromic     microcytic anemia. 2. History of recurrent gastrointestinal bleeding in the past     secondary to antral arteriovenous malformation/gastritis. 3. Status post orthostatic hypotension probably secondary to     medications. 4. History of severe aortic stenosis status post AVR in the past. 5. Coronary artery disease. 6. Hypertension. 7. Diabetes mellitus controlled by diet. 8. Gastroesophageal reflux disease. 9. History of colonic polyps. 10.Hypercholesteremia. 11.Chronic anemia.  DISCHARGE HOME MEDICATIONS: 1. Ferrous gluconate 324 mg 1 tablet daily. 2. Protonix 40 mg twice daily. 3. Quinapril 20 mg daily. 4. Atorvastatin 40 mg daily. 5. [place] eyedrops as before. 6. Carvedilol 3.125 mg twice daily. 7. Proscar 5 mg daily. 8. Meclizine 12.5 mg 3 times daily. 9. Carafate 1 g 4 times daily.  The patient has been advised to stop clonidine and  amlodipine.  DIET:  Low salt, low cholesterol, 1800 calories ADA diet.  FOLLOWUP:  Follow up with me in 1 week.  Follow up with GI in 2 weeks.  CONDITION AT DISCHARGE:  Stable.  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 Denies any bright red blood per rectum or melena.  Denies syncopal episode.  The patient was noted to be hypotensive in the ED with blood pressure of 80 systolic.  The patient was recently discharged from the hospital, received 1 unit of packed RBC approximately 2 weeks ago, had EGD which showed gastritis.  There was no evidence of acute bleed.  The patient denies any chest pain, nausea, vomiting, diaphoresis.  Denies palpitation, lightheadedness, or syncope.  States he has not been taking any aspirin or NSAIDs and has been off Coumadin for many months.  PHYSICAL EXAMINATION:  GENERAL AND VITAL SIGNS:  He was alert, awake, oriented x3 when seen  in the ED after receiving fluid bolus, blood pressure was 132/57, pulse was 69, he was afebrile. HEENT:  Conjunctivae was pale.  Sclerae is nonicteric. NECK:  Supple.  No JVD. LUNGS:  Clear to auscultation without rhonchi or rales. CARDIOVASCULAR EXAM:  S1, S2 was normal.  There was 3/6 systolic murmur noted. ABDOMEN:  Soft.  Bowel sounds were present.  There was mild tenderness. No guarding or rebound. EXTREMITIES:  There is no clubbing, cyanosis, or  edema.  LABORATORY DATA:  Sodium was 137, potassium 4.2, BUN 17, creatinine was 1.50.  Hemoglobin was 9.4, hematocrit 29.6, white count of 9.5.  Repeat hemoglobin was 8.1, hematocrit 25.2 which has been stable.  Today, hemoglobin is 8.3, hematocrit 26.2, white count of 9.9.  BRIEF HOSPITAL COURSE:  The patient was admitted to telemetry unit.  His medications were adjusted.  GI consultation was obtained.  The patient was felt not the candidate for repeat endoscopy as patient had recent upper endoscopy.  His hemoglobin drop is mostly attributed to hydration in the ED.  His hemoglobin has remained stable, although he constantly have heme-positive stool with microcytic hypochromic anemia.  From possible chronic upper GI bleed, the patient was felt not the candidate for repeat endoscopy as patient had recent endoscopy.  The patient's hemoglobin is stable and has been ambulating in room without any problems.  The patient has no further orthostatic hypotension.  The patient will be discharged home on above medications and will be followed up in my office in 1 week and GI in 2 weeks.     Allegra Lai. Terrence Dupont, M.D.     [facility]/MEDQ  D:  04/07/2014  T:  04/07/2014  Job:  [id number]

## 2014-05-03 DEATH — deceased
